# Patient Record
Sex: Female | Born: 1965 | Hispanic: No | Smoking: Never smoker
Health system: Southern US, Community
[De-identification: ages and names within clinical notes are randomized; demographics above are authoritative.]

## PROBLEM LIST (undated history)

## (undated) DIAGNOSIS — E119 Type 2 diabetes mellitus without complications: Secondary | ICD-10-CM

## (undated) DIAGNOSIS — E785 Hyperlipidemia, unspecified: Secondary | ICD-10-CM

## (undated) HISTORY — PX: DILATION AND CURETTAGE OF UTERUS: SHX78

## (undated) HISTORY — DX: Type 2 diabetes mellitus without complications: E11.9

## (undated) HISTORY — DX: Hyperlipidemia, unspecified: E78.5

---
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
## 2016-05-27 ENCOUNTER — Other Ambulatory Visit: Payer: Self-pay | Admitting: Obstetrics and Gynecology

## 2016-05-27 DIAGNOSIS — Z1239 Encounter for other screening for malignant neoplasm of breast: Secondary | ICD-10-CM

## 2016-06-04 ENCOUNTER — Encounter (HOSPITAL_COMMUNITY): Payer: Self-pay

## 2016-06-04 ENCOUNTER — Ambulatory Visit
Admission: RE | Admit: 2016-06-04 | Discharge: 2016-06-04 | Disposition: A | Discharge status: Home / Self Care | Payer: Self-pay | Source: Ambulatory Visit | Attending: Obstetrics and Gynecology | Admitting: Obstetrics and Gynecology

## 2016-06-04 ENCOUNTER — Ambulatory Visit (HOSPITAL_COMMUNITY)
Admission: RE | Admit: 2016-06-04 | Discharge: 2016-06-04 | Disposition: A | Discharge status: Home / Self Care | Payer: Self-pay | Source: Ambulatory Visit | Attending: Obstetrics and Gynecology | Admitting: Obstetrics and Gynecology

## 2016-06-04 VITALS — BP 120/78 | Temp 98.8°F | Ht 68.5 in | Wt 241.4 lb

## 2016-06-04 DIAGNOSIS — Z1239 Encounter for other screening for malignant neoplasm of breast: Secondary | ICD-10-CM

## 2016-06-04 NOTE — Patient Instructions (Signed)
Educational materials on self breast awareness given. Explained to [name] that she did not need a Pap smear today due to last Pap smear was in July 2015 per patient. Let her know [facility] will cover Pap smears every 3 years unless has a history of abnormal Pap smears. [facility]. Appointment scheduled for Thursday, June 04, 2016 at 1130. Let patient know the Breast Center will follow up with her within the next couple weeks with results of mammogram by letter or phone. [name] verbalized understanding.  Oaklie Durrett, Kathaleen Maserhristine Poll, RN 11:09 AM

## 2016-06-04 NOTE — Progress Notes (Signed)
No complaints today.   Pap Smear:  Pap smear not completed today. Last Pap [place] was in July 2015 in the [place] and normal per patient. Per patient has no history of an abnormal Pap smear. No Pap smear results are in EPIC.  Physical exam: Breasts Breasts symmetrical. No skin abnormalities bilateral breasts. No nipple retraction bilateral breasts. No nipple discharge bilateral breasts. No lymphadenopathy. No lumps palpated bilateral breasts. No complaints of pain or tenderness on exam.     [facility]. Appointment scheduled for Thursday, June 04, 2016 at 1130.  Pelvic/Bimanual No Pap smear completed today since last Pap smear was in July 2015 per patient. Pap smear not indicated per [facility] guidelines.   Smoking History: Patient has never smoked.  Patient Navigation: Patient education provided. [facility].

## 2016-06-05 ENCOUNTER — Encounter (HOSPITAL_COMMUNITY): Payer: Self-pay | Admitting: *Deleted

## 2016-06-26 ENCOUNTER — Telehealth: Payer: Self-pay

## 2016-06-26 NOTE — Telephone Encounter (Signed)
Called to inform about [facility]. Left message for patient to return call.

## 2017-01-01 ENCOUNTER — Encounter (HOSPITAL_COMMUNITY): Payer: Self-pay | Admitting: *Deleted

## 2017-01-01 ENCOUNTER — Ambulatory Visit (HOSPITAL_COMMUNITY)
Admission: RE | Admit: 2017-01-01 | Discharge: 2017-01-01 | Disposition: A | Discharge status: Home / Self Care | Payer: No Typology Code available for payment source | Source: Ambulatory Visit | Attending: Obstetrics and Gynecology | Admitting: Obstetrics and Gynecology

## 2017-01-01 VITALS — BP 118/80 | Ht 68.5 in | Wt 241.2 lb

## 2017-01-01 DIAGNOSIS — Z01419 Encounter for gynecological examination (general) (routine) without abnormal findings: Secondary | ICD-10-CM

## 2017-01-01 NOTE — Progress Notes (Signed)
Pap smear completed

## 2017-01-01 NOTE — Addendum Note (Signed)
Encounter addended by: Lynnell DikeSabrina H Shivaun Bilello, LPN on: 6/2/13082/07/2017  2:13 PM<BR>    Actions taken: Order list changed

## 2017-01-01 NOTE — Patient Instructions (Signed)
Explained to [name] that if today's Pap smear is normal and HPV negative that her next Pap smear will be due in 5 years since per patient has no history of an abnormal Pap smear. Reminded patient that her mammogram is due in July 2018 and she will need to renew her [facility] card. Told patient to call Martie LeeSabrina a month or two before to schedule appointment with [facility]. Let patient know will follow up with her within the next couple weeks with results of Pap smear by phone. [name] verbalized understanding.  [place], Kathaleen Maserhristine Poll, RN 1:58 PM

## 2017-01-01 NOTE — Progress Notes (Signed)
No complaints today.   Pap Smear: Pap smear completed today. Last Pap smear was in July 2015 in the [place] and normal per patient. Per patient has no history of an abnormal Pap smear. No Pap smear results are in EPIC.  Pelvic/Bimanual   Ext Genitalia No lesions, no swelling and no discharge observed on external genitalia.         Vagina Vagina pink and normal texture. No lesions and cervical mucous observed in vagina.          Cervix Cervix is present. Cervix pink and of normal texture. No discharge observed.     Uterus Uterus is present and palpable. Uterus in normal position and normal size.        Adnexae Bilateral ovaries present and palpable. No tenderness on palpation.          Rectovaginal No rectal exam completed today since patient had no rectal complaints. No skin abnormalities observed on exam.    Smoking History: Patient has never smoked.  Patient Navigation: Patient education provided. Access to services provided for patient through [facility] program.   Colorectal Cancer Screening: Per patient has never had a colonoscopy completed. No complaints today. FIT Test given to patient to complete and return to [facility].

## 2017-01-06 LAB — CYTOLOGY - PAP
Diagnosis: UNDETERMINED — AB
HPV: DETECTED — AB

## 2017-01-11 ENCOUNTER — Telehealth (HOSPITAL_COMMUNITY): Payer: Self-pay | Admitting: *Deleted

## 2017-01-11 NOTE — Telephone Encounter (Signed)
Telephoned patient at home number and discussed abnormal pap smear results. Advised patient would need colposcopy due to abnormal pap smear. [facility] 10:20. Patient voiced understanding.

## 2017-02-01 ENCOUNTER — Encounter: Payer: Self-pay | Admitting: Obstetrics and Gynecology

## 2017-02-01 ENCOUNTER — Other Ambulatory Visit (HOSPITAL_COMMUNITY)
Admission: RE | Admit: 2017-02-01 | Discharge: 2017-02-01 | Disposition: A | Discharge status: Home / Self Care | Payer: Self-pay | Source: Ambulatory Visit | Attending: Obstetrics and Gynecology | Admitting: Obstetrics and Gynecology

## 2017-02-01 ENCOUNTER — Ambulatory Visit (INDEPENDENT_AMBULATORY_CARE_PROVIDER_SITE_OTHER): Payer: No Typology Code available for payment source | Admitting: Obstetrics and Gynecology

## 2017-02-01 DIAGNOSIS — Z3202 Encounter for pregnancy test, result negative: Secondary | ICD-10-CM

## 2017-02-01 DIAGNOSIS — R87810 Cervical high risk human papillomavirus (HPV) DNA test positive: Secondary | ICD-10-CM

## 2017-02-01 DIAGNOSIS — Z1151 Encounter for screening for human papillomavirus (HPV): Secondary | ICD-10-CM | POA: Insufficient documentation

## 2017-02-01 DIAGNOSIS — R87610 Atypical squamous cells of undetermined significance on cytologic smear of cervix (ASC-US): Secondary | ICD-10-CM | POA: Insufficient documentation

## 2017-02-01 LAB — POCT PREGNANCY, URINE: Preg Test, Ur: NEGATIVE

## 2017-02-01 NOTE — Patient Instructions (Signed)

## 2017-02-01 NOTE — Progress Notes (Signed)
Colposcopy Procedure Note  Indications: Pap smear 1 months ago showed: ASCUS with POSITIVE high risk HPV. The prior pap showed no abnormalities.  H/O of abnormal pap smear in the distant past, repeat was normal. Sexual active, no contraception and no H/O STD's. No prior cervical treatmentsPrior cervical/vaginal disease: UPT negative   Procedure Details  The risks and benefits of the procedure and Verbal informed consent obtained.  Speculum placed in vagina and excellent visualization of cervix achieved, cervix swabbed x 3 with acetic acid solution.  Findings: Cervix: no visible lesions;  D[place] noted, TZ noted Vaginal inspection: vaginal colposcopy not performed. Vulvar colposcopy: vulvar colposcopy not performed.  Specimens: ECC, Bx 12 and 6 o'clock Monsel's applied. Pt tolerated well  Complications: none.  Plan: Specimens labelled and sent to Pathology.

## 2017-02-01 NOTE — Addendum Note (Signed)
Addended by: Hermina StaggersERVIN, Honi Name L on: 02/01/2017 11:22 AM   Modules accepted: Level of Service

## 2017-02-09 ENCOUNTER — Telehealth: Payer: Self-pay | Admitting: General Practice

## 2017-02-09 NOTE — Telephone Encounter (Signed)
Per Dr Alysia PennaErvin, patient needs cryo. Called patient & informed her of results, recommendation, and that someone from the front office will contact her with an appt. Patient verbalized understanding and had no questions.

## 2017-02-10 ENCOUNTER — Telehealth: Payer: Self-pay | Admitting: General Practice

## 2017-03-05 ENCOUNTER — Ambulatory Visit (INDEPENDENT_AMBULATORY_CARE_PROVIDER_SITE_OTHER): Payer: Self-pay | Admitting: Obstetrics and Gynecology

## 2017-03-05 ENCOUNTER — Encounter: Payer: Self-pay | Admitting: Obstetrics and Gynecology

## 2017-03-05 DIAGNOSIS — Z3202 Encounter for pregnancy test, result negative: Secondary | ICD-10-CM

## 2017-03-05 DIAGNOSIS — N870 Mild cervical dysplasia: Secondary | ICD-10-CM

## 2017-03-05 LAB — POCT PREGNANCY, URINE: Preg Test, Ur: NEGATIVE

## 2017-03-05 NOTE — Progress Notes (Signed)
Cryotherapy Procedure Note  Pre-operative Diagnosis: mild cervical dysplasia   Post-operative Diagnosis: mild cervical dysplasia  Locations: cervix   Indications: mild cervical dysplasia  Anesthesia: not required   Procedure Details  History of allergy to iodine: no. Pacemaker? no.  Patient informed of risk and benefits of the procedure and  Verbal and written informed consent obtained.  The areas are treated with liquid nitrogen therapy, 3 minute freeze followed by 1 minute thaw. This was repeat x 1. The patient tolerated procedure well.  The patient was instructed on post-op care. Instructed to RTC for any concerns  Condition: Stable  Complications: none.  Plan: F/U appt in 4 weeks

## 2017-03-06 LAB — POCT PREGNANCY, URINE: PREG TEST UR: NEGATIVE

## 2017-04-09 ENCOUNTER — Ambulatory Visit (INDEPENDENT_AMBULATORY_CARE_PROVIDER_SITE_OTHER): Payer: Self-pay | Admitting: Obstetrics and Gynecology

## 2017-04-09 DIAGNOSIS — N870 Mild cervical dysplasia: Secondary | ICD-10-CM

## 2017-04-09 NOTE — Progress Notes (Signed)
Ms Cox is her for follow after cryotherapy on 03/05/17 for mild cervical dysplasia. She is without complaints today. She has had a cycle since the procedure. Vaginal discharge is min She denies any bowel or bladder dysfunction  PE AF VSS GU cervix healing well  A/P S/P Cryotherapy of cervix for mild cervical dysplasia  Continue with normal ADL's.  F/U with [facility]CP in 1 yearly for yearly exam.

## 2017-05-12 ENCOUNTER — Other Ambulatory Visit: Payer: Self-pay | Admitting: Obstetrics and Gynecology

## 2017-05-12 DIAGNOSIS — Z1231 Encounter for screening mammogram for malignant neoplasm of breast: Secondary | ICD-10-CM

## 2017-06-10 ENCOUNTER — Ambulatory Visit (HOSPITAL_COMMUNITY)
Admission: RE | Admit: 2017-06-10 | Discharge: 2017-06-10 | Disposition: A | Discharge status: Home / Self Care | Payer: Self-pay | Source: Ambulatory Visit | Attending: Obstetrics and Gynecology | Admitting: Obstetrics and Gynecology

## 2017-06-10 ENCOUNTER — Ambulatory Visit
Admission: RE | Admit: 2017-06-10 | Discharge: 2017-06-10 | Disposition: A | Discharge status: Home / Self Care | Payer: Self-pay | Source: Ambulatory Visit | Attending: Obstetrics and Gynecology | Admitting: Obstetrics and Gynecology

## 2017-06-10 ENCOUNTER — Encounter (HOSPITAL_COMMUNITY): Payer: Self-pay

## 2017-06-10 VITALS — BP 126/80 | Temp 98.5°F | Ht 68.0 in

## 2017-06-10 DIAGNOSIS — Z1239 Encounter for other screening for malignant neoplasm of breast: Secondary | ICD-10-CM

## 2017-06-10 DIAGNOSIS — Z1231 Encounter for screening mammogram for malignant neoplasm of breast: Secondary | ICD-10-CM

## 2017-06-10 NOTE — Patient Instructions (Signed)
Explained breast self awareness with Evelina Bucyoreen Cox. Patient did not need a Pap smear today due to last Pap smear was 01/06/2017. Let patient know that her next Pap smear is due next March 2019 due to her recent history of an abnormal Pap smear. [facility]. Appointment scheduled for Thursday, June 10, 2017 at 1040. Let patient know the Breast Center will follow up with her within the next couple weeks with results of mammogram by letter or phone. Paul DykesDoreen Cox verbalized understanding.  Evalette Montrose, Kathaleen Maserhristine Poll, RN 12:56 PM

## 2017-06-10 NOTE — Progress Notes (Signed)
No complaints today.   Pap Smear: Pap smear not completed today. [facility] ASC[facility] with positive HPV. Patient had a colposcopy completed 02/01/2017 that was CIN-I for follow-up. Cryotherapy was completed 03/05/2017. Per patient her last Pap smear is the only abnormal Pap smear she has had. Last Pap smear, colposcopy, and cryotherapy results are in EPIC.  Physical exam: Breasts Breasts symmetrical. No skin abnormalities bilateral breasts. No nipple retraction bilateral breasts. No nipple discharge bilateral breasts. No lymphadenopathy. No lumps palpated bilateral breasts. No complaints of pain or tenderness on exam. [facility] mamm[facility]. Appointment scheduled for Thursday, June 10, 2017 at 1040.        Pelvic/Bimanual No Pap smear completed today since last Pap smear was 01/06/2017. Pap smear not indicated per BCCCP guidelines.   Smoking History: Patient has never smoked.  Patient Navigation: Patient education provided. Access to services provided for patient through [facility] program.   Colorectal Cancer Screening: Per patient has never had a colonoscopy completed. No complaints today. FIT Test given to patient to complete and return to [facility].

## 2017-06-11 ENCOUNTER — Encounter (HOSPITAL_COMMUNITY): Payer: Self-pay | Admitting: *Deleted

## 2018-02-05 ENCOUNTER — Other Ambulatory Visit: Payer: Self-pay

## 2018-02-17 LAB — FECAL OCCULT BLOOD, IMMUNOCHEMICAL: Fecal Occult Bld: NEGATIVE

## 2018-02-21 ENCOUNTER — Encounter (HOSPITAL_COMMUNITY): Payer: Self-pay

## 2018-04-05 ENCOUNTER — Ambulatory Visit (HOSPITAL_COMMUNITY): Payer: No Typology Code available for payment source

## 2018-05-13 ENCOUNTER — Other Ambulatory Visit: Payer: Self-pay | Admitting: Obstetrics and Gynecology

## 2018-05-13 DIAGNOSIS — Z1231 Encounter for screening mammogram for malignant neoplasm of breast: Secondary | ICD-10-CM

## 2018-06-16 ENCOUNTER — Ambulatory Visit (HOSPITAL_COMMUNITY)
Admission: RE | Admit: 2018-06-16 | Discharge: 2018-06-16 | Disposition: A | Discharge status: Home / Self Care | Payer: Self-pay | Source: Ambulatory Visit | Attending: Obstetrics and Gynecology | Admitting: Obstetrics and Gynecology

## 2018-06-16 ENCOUNTER — Encounter (HOSPITAL_COMMUNITY): Payer: Self-pay

## 2018-06-16 ENCOUNTER — Ambulatory Visit
Admission: RE | Admit: 2018-06-16 | Discharge: 2018-06-16 | Disposition: A | Discharge status: Home / Self Care | Payer: No Typology Code available for payment source | Source: Ambulatory Visit | Attending: Obstetrics and Gynecology | Admitting: Obstetrics and Gynecology

## 2018-06-16 VITALS — BP 152/100 | Ht 68.5 in | Wt 257.2 lb

## 2018-06-16 DIAGNOSIS — Z1231 Encounter for screening mammogram for malignant neoplasm of breast: Secondary | ICD-10-CM

## 2018-06-16 DIAGNOSIS — Z01419 Encounter for gynecological examination (general) (routine) without abnormal findings: Secondary | ICD-10-CM

## 2018-06-16 NOTE — Patient Instructions (Signed)
Explained breast self awareness with Tanya Bucyoreen Bowen. Let patient know that if today's Pap smear is normal that her next Pap smear will be due in one year. [facility] mamm[facility]. Appointment scheduled for Thursday, June 16, 2018 at 1240. Let patient know will follow up with her within the next couple weeks with results of Pap smear by letter or phone. Informed patient that the Breast Center will follow-up with her within the next couple of weeks with results of mammogram by letter or phone. [name] verbalized understanding.  [name], Kathaleen Maserhristine Poll, RN 10:44 AM

## 2018-06-16 NOTE — Progress Notes (Signed)
No complaints today.   Pap Smear: Pap smear not completed today. [facility] with positive HPV. Patient had a colposcopy completed 02/01/2017 that was CIN-I for follow-up. Cryotherapy was completed 03/05/2017. Per patient her last Pap smear is the only abnormal Pap smear she has had. Last Pap smear, colposcopy, and cryotherapy results are in EPIC.  Physical exam: Breasts Breasts symmetrical. No skin abnormalities bilateral breasts. No nipple retraction bilateral breasts. No nipple discharge bilateral breasts. No lymphadenopathy. No lumps palpated bilateral breasts. No complaints of pain or tenderness on exam. [facility] mamm[facility]. Appointment scheduled for Thursday, June 16, 2018 at 1240.     Pelvic/Bimanual   Ext Genitalia No lesions, no swelling and no discharge observed on external genitalia.         Vagina Vagina pink and normal texture. No lesions or discharge observed in vagina.          Cervix Cervix is present. Cervix pink and of normal texture. No discharge observed.     Uterus Uterus is present and palpable. Uterus in normal position and normal size.        Adnexae Bilateral ovaries present and palpable. No tenderness on palpation.         Rectovaginal No rectal exam completed today since patient had no rectal complaints. No skin abnormalities observed on exam.    Smoking History: Patient has never smoked.  Patient Navigation: Patient education provided. Access to services provided for patient through [facility] program.  Colorectal Cancer Screening: Per patient has never had a colonoscopy completed. FIT Test completed 02/05/2018 that was negative. No complaints today.   Breast and Cervical Cancer Risk Assessment: Patient has no family history of breast cancer, known genetic mutations, or radiation treatment to the chest before age 52. Patient has a history of cervical dysplasia.  Patient has no history of being immunocompromised or DES exposure in-utero.  Risk Assessment    Risk Scores      06/16/2018   Last edited by: Lynnell DikeHolland, Sabrina H, LPN   5-year risk: 1.2 %   Lifetime risk: 8.5 %

## 2018-06-17 ENCOUNTER — Encounter (HOSPITAL_COMMUNITY): Payer: Self-pay | Admitting: *Deleted

## 2018-06-17 LAB — CYTOLOGY - PAP
Adequacy: ABSENT
DIAGNOSIS: NEGATIVE
HPV (WINDOPATH): NOT DETECTED

## 2018-06-20 ENCOUNTER — Telehealth (HOSPITAL_COMMUNITY): Payer: Self-pay | Admitting: *Deleted

## 2018-06-20 NOTE — Telephone Encounter (Signed)
Called patient to discuss Pap smear results. Let patient know that her Pap smear was normal and HPV typing was negative. Told patient that her next Pap smear is due in one year due to her Pap smear last year was abnormal. Patient verbalized understanding.

## 2020-10-23 HISTORY — PX: COLONOSCOPY: SHX174

## 2021-02-13 ENCOUNTER — Ambulatory Visit: Payer: No Typology Code available for payment source | Admitting: Obstetrics and Gynecology

## 2021-07-15 ENCOUNTER — Other Ambulatory Visit: Payer: Self-pay | Admitting: Family Medicine

## 2021-07-15 DIAGNOSIS — Z1231 Encounter for screening mammogram for malignant neoplasm of breast: Secondary | ICD-10-CM

## 2021-08-07 ENCOUNTER — Other Ambulatory Visit: Payer: Self-pay

## 2021-08-07 ENCOUNTER — Ambulatory Visit
Admission: RE | Admit: 2021-08-07 | Discharge: 2021-08-07 | Disposition: A | Discharge status: Home / Self Care | Payer: 59 | Source: Ambulatory Visit | Attending: Family Medicine | Admitting: Family Medicine

## 2021-08-07 DIAGNOSIS — Z1231 Encounter for screening mammogram for malignant neoplasm of breast: Secondary | ICD-10-CM

## 2021-08-08 ENCOUNTER — Ambulatory Visit: Payer: No Typology Code available for payment source

## 2021-08-13 ENCOUNTER — Other Ambulatory Visit: Payer: Self-pay | Admitting: Obstetrics and Gynecology

## 2021-08-13 ENCOUNTER — Other Ambulatory Visit: Payer: Self-pay | Admitting: Family Medicine

## 2021-08-13 ENCOUNTER — Other Ambulatory Visit: Payer: Self-pay | Admitting: *Deleted

## 2021-08-13 DIAGNOSIS — R928 Other abnormal and inconclusive findings on diagnostic imaging of breast: Secondary | ICD-10-CM

## 2021-08-14 ENCOUNTER — Other Ambulatory Visit: Payer: Self-pay | Admitting: Family Medicine

## 2021-08-14 DIAGNOSIS — R928 Other abnormal and inconclusive findings on diagnostic imaging of breast: Secondary | ICD-10-CM

## 2021-08-29 ENCOUNTER — Ambulatory Visit
Admission: RE | Admit: 2021-08-29 | Discharge: 2021-08-29 | Disposition: A | Discharge status: Home / Self Care | Payer: 59 | Source: Ambulatory Visit | Attending: Family Medicine | Admitting: Family Medicine

## 2021-08-29 ENCOUNTER — Other Ambulatory Visit: Payer: Self-pay

## 2021-08-29 DIAGNOSIS — R928 Other abnormal and inconclusive findings on diagnostic imaging of breast: Secondary | ICD-10-CM

## 2021-09-01 ENCOUNTER — Other Ambulatory Visit: Payer: Self-pay | Admitting: Family Medicine

## 2021-09-01 ENCOUNTER — Ambulatory Visit
Admission: RE | Admit: 2021-09-01 | Discharge: 2021-09-01 | Disposition: A | Discharge status: Home / Self Care | Payer: 59 | Source: Ambulatory Visit | Attending: Family Medicine | Admitting: Family Medicine

## 2021-09-01 DIAGNOSIS — M25532 Pain in left wrist: Secondary | ICD-10-CM

## 2021-11-14 ENCOUNTER — Other Ambulatory Visit: Payer: Self-pay | Admitting: Surgery

## 2021-12-25 IMAGING — MG MM DIGITAL SCREENING BILAT W/ TOMO AND CAD
6 of 10 series · 6 of 30 positions shown · non-contrast
Comparison: Previous exam(s).

CLINICAL DATA: Screening.

EXAM:
DIGITAL SCREENING BILATERAL MAMMOGRAM WITH TOMOSYNTHESIS AND CAD
TECHNIQUE: Bilateral screening digital craniocaudal and mediolateral oblique
mammograms were obtained. Bilateral screening digital breast
tomosynthesis was performed. The images were evaluated with
computer-aided detection.

[L MLO synth-2D]
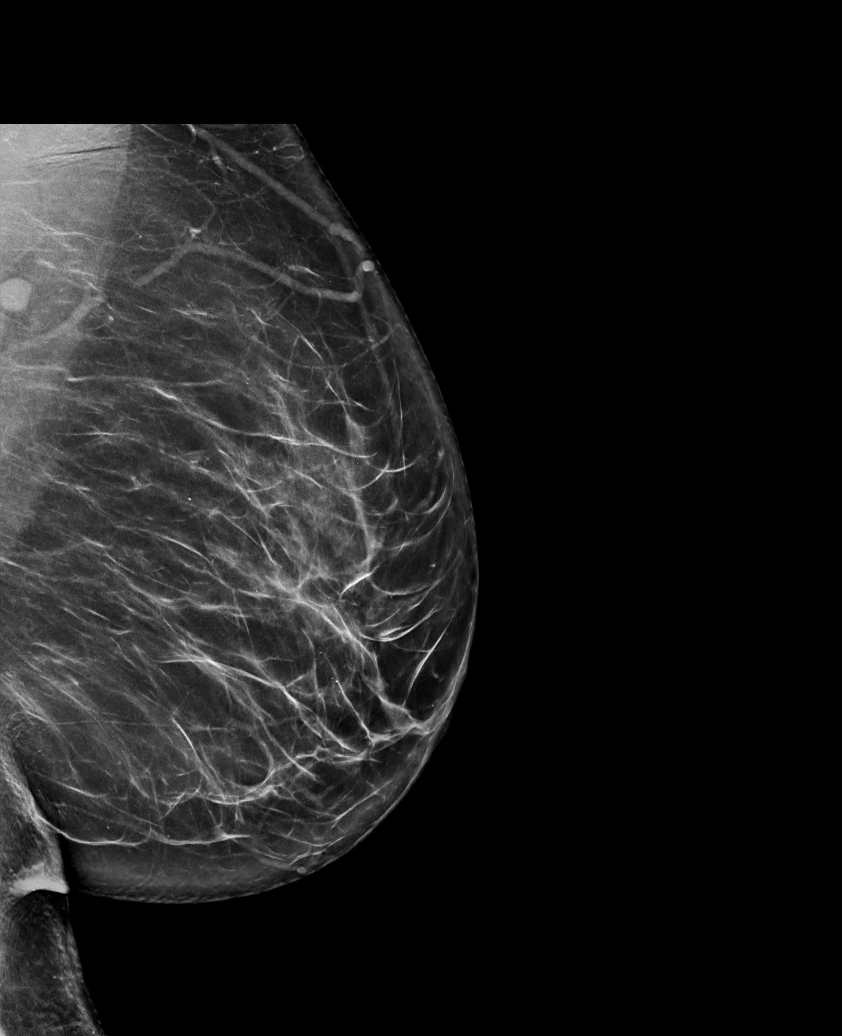

[L CC synth-2D (1 of 2)]
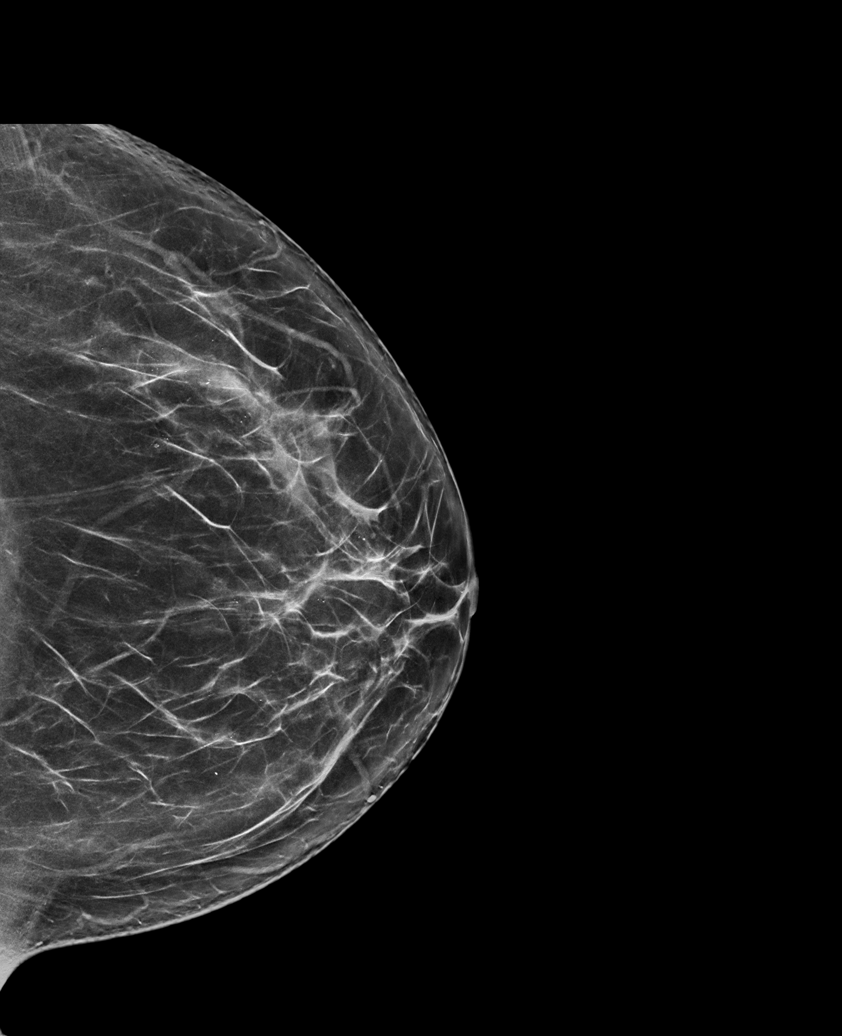

[R CC synth-2D]
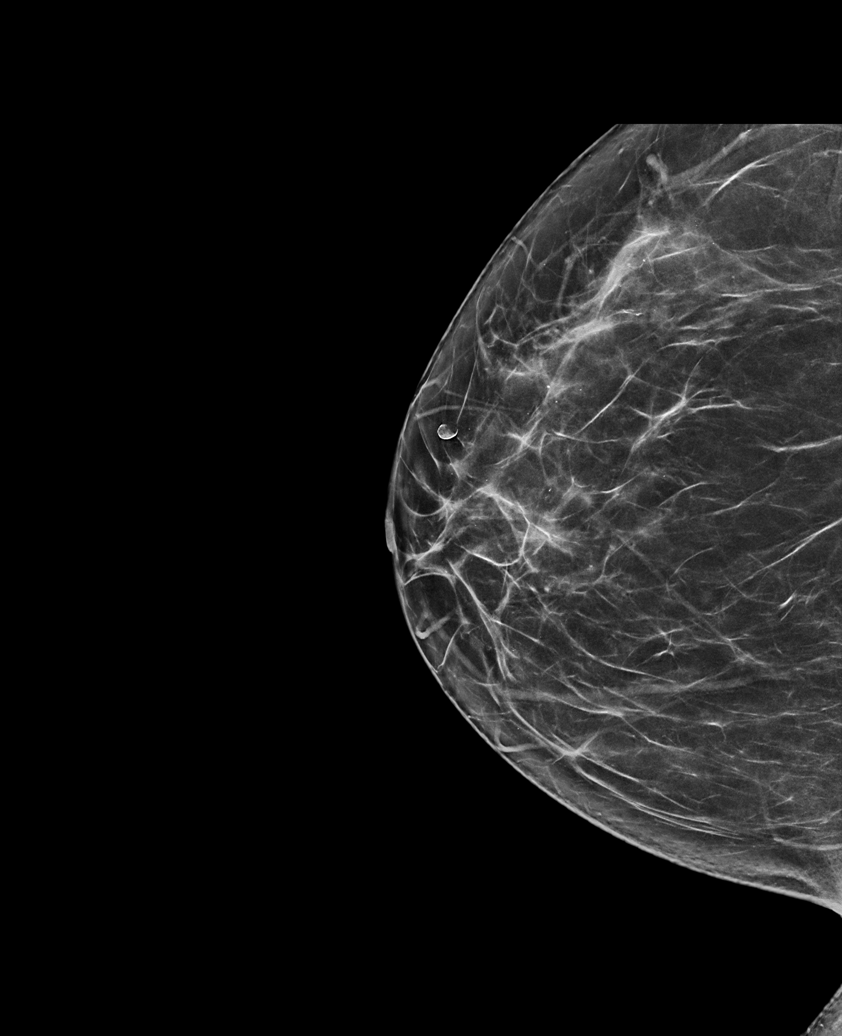

[R MLO synth-2D]
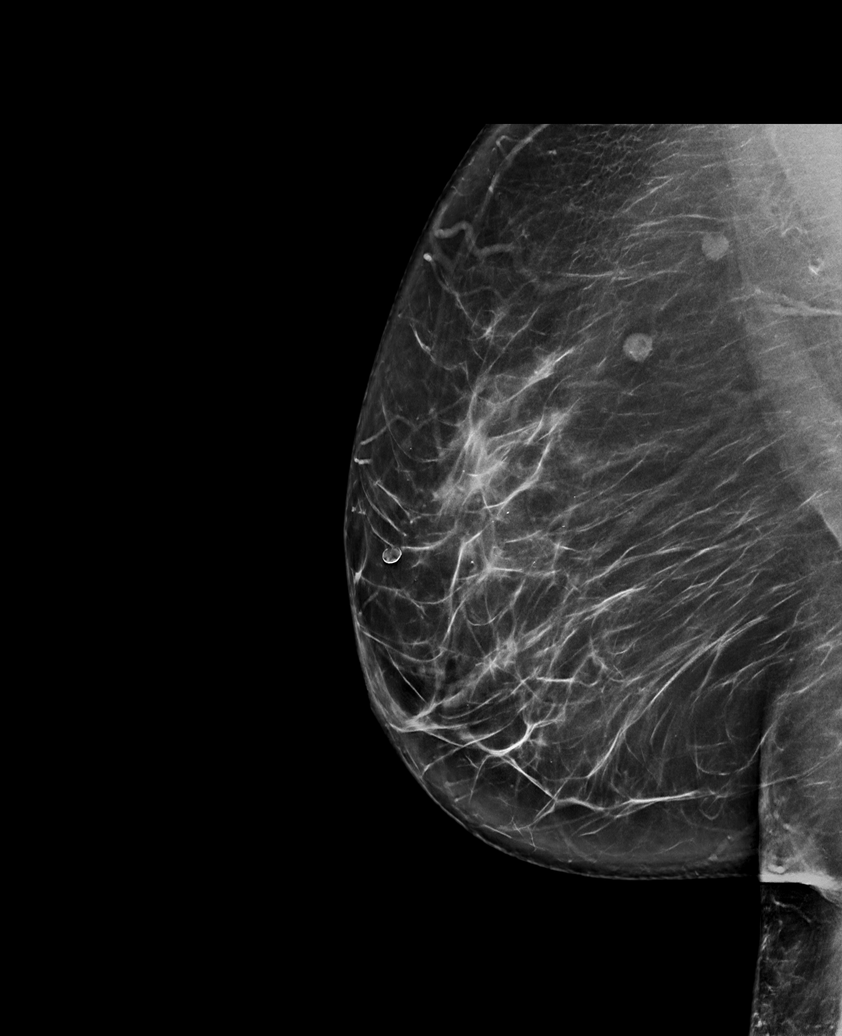

[L CC synth-2D (2 of 2)]
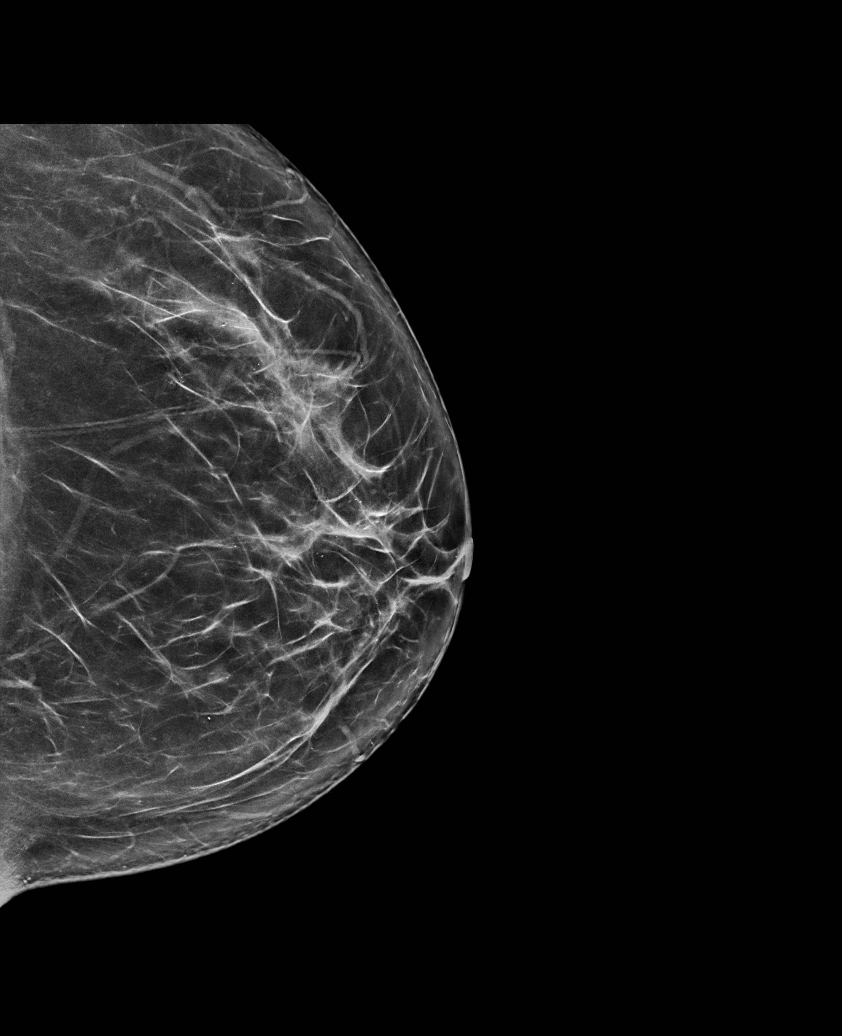

[L MLO tomo · tomo slice 43/86.0]
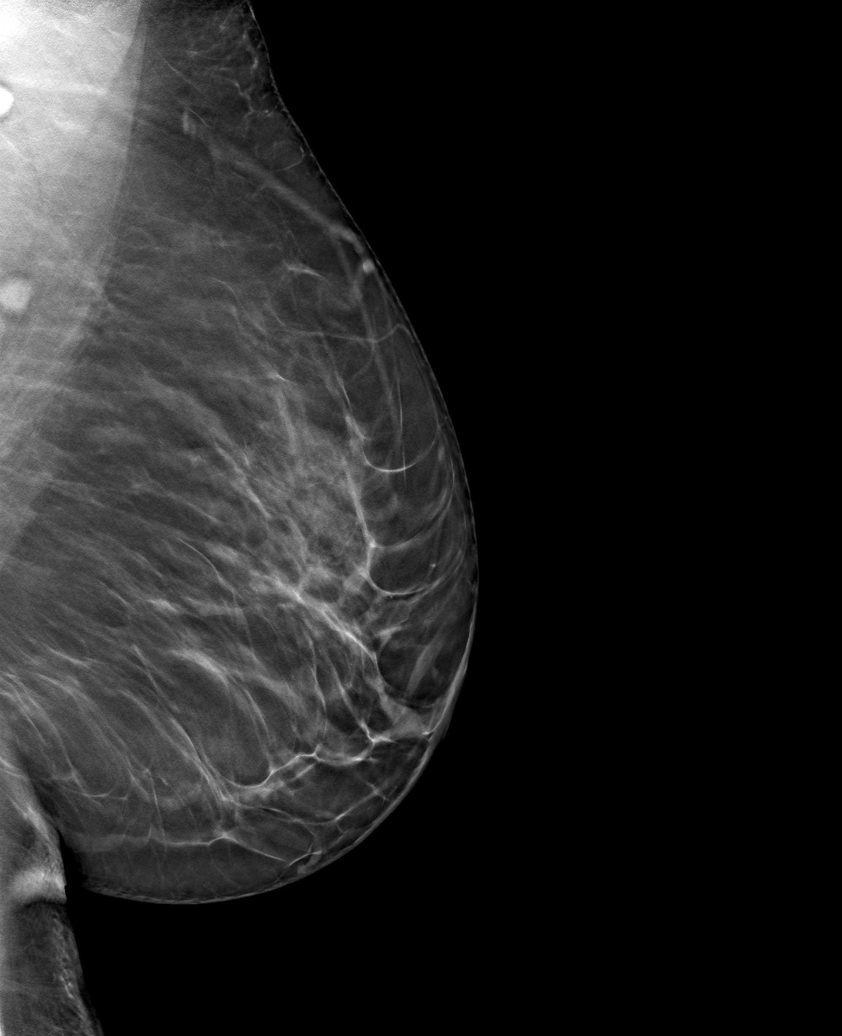

[6 of 30 positions shown; findings below may reference images not displayed]

ACR Breast Density Category b: There are scattered areas of
fibroglandular density.
FINDINGS: In the right breast a possible asymmetry requires further
evaluation.

In the left breast a possible mass requires further evaluation.
IMPRESSION: Further evaluation is suggested for possible asymmetry in the right
breast.

Further evaluation is suggested for possible mass in the left
breast.

RECOMMENDATION:
Diagnostic mammogram and possibly ultrasound of both breasts.
(Code:[id number])

The patient will be contacted regarding the findings, and additional
imaging will be scheduled.

BI-RADS CATEGORY  0: Incomplete. Need additional imaging evaluation
and/or prior mammograms for comparison.

## 2023-04-05 ENCOUNTER — Other Ambulatory Visit: Payer: Self-pay | Admitting: Family Medicine

## 2023-04-05 DIAGNOSIS — Z0000 Encounter for general adult medical examination without abnormal findings: Secondary | ICD-10-CM

## 2023-04-06 ENCOUNTER — Ambulatory Visit
Admission: RE | Admit: 2023-04-06 | Discharge: 2023-04-06 | Disposition: A | Discharge status: Home / Self Care | Payer: Commercial Managed Care - PPO | Source: Ambulatory Visit | Attending: Family Medicine | Admitting: Family Medicine

## 2023-04-06 DIAGNOSIS — Z1231 Encounter for screening mammogram for malignant neoplasm of breast: Secondary | ICD-10-CM | POA: Diagnosis not present

## 2023-04-06 DIAGNOSIS — Z0000 Encounter for general adult medical examination without abnormal findings: Secondary | ICD-10-CM

## 2023-06-04 ENCOUNTER — Other Ambulatory Visit: Payer: Self-pay | Admitting: Oncology

## 2023-06-04 DIAGNOSIS — Z006 Encounter for examination for normal comparison and control in clinical research program: Secondary | ICD-10-CM

## 2023-09-07 ENCOUNTER — Ambulatory Visit: Payer: Commercial Managed Care - PPO | Admitting: Family

## 2023-09-07 ENCOUNTER — Encounter: Payer: Self-pay | Admitting: Family

## 2023-09-07 VITALS — BP 127/88 | HR 81 | Temp 98.1°F | Resp 18 | Ht 68.5 in | Wt 260.8 lb

## 2023-09-07 DIAGNOSIS — Z1159 Encounter for screening for other viral diseases: Secondary | ICD-10-CM | POA: Diagnosis not present

## 2023-09-07 DIAGNOSIS — E782 Mixed hyperlipidemia: Secondary | ICD-10-CM

## 2023-09-07 DIAGNOSIS — L819 Disorder of pigmentation, unspecified: Secondary | ICD-10-CM

## 2023-09-07 DIAGNOSIS — N870 Mild cervical dysplasia: Secondary | ICD-10-CM | POA: Diagnosis not present

## 2023-09-07 DIAGNOSIS — R0683 Snoring: Secondary | ICD-10-CM | POA: Diagnosis not present

## 2023-09-07 DIAGNOSIS — Z13220 Encounter for screening for lipoid disorders: Secondary | ICD-10-CM

## 2023-09-07 DIAGNOSIS — Z7689 Persons encountering health services in other specified circumstances: Secondary | ICD-10-CM

## 2023-09-07 DIAGNOSIS — Z113 Encounter for screening for infections with a predominantly sexual mode of transmission: Secondary | ICD-10-CM | POA: Diagnosis not present

## 2023-09-07 NOTE — Progress Notes (Signed)
Provider: Richarda Blade FNP-C   Horald Birky, Donalee Citrin, NP  Patient Care Team: Mone Commisso, Donalee Citrin, NP as PCP - General (Family Medicine)  Extended Emergency Contact Information Primary Emergency Contact: Cox,Denicia  [place] Mobile Phone: [phone] Relation: Daughter  Code Status:  Full Code  Goals of care: Advanced Directive information    09/07/2023   12:38 PM  Advanced Directives  Does Patient Have a Medical Advance Directive? No  Would patient like information on creating a medical advance directive? No - Patient declined     Chief Complaint  Patient presents with   Establish Care    new pat to be est    HPI:  Pt is a 57 y.o. female seen today establish care here at [facility] for medical management of chronic diseases.  Hyperlipidemia - high last year she was prescribe cholesterol medication but states did not take it.takes omega-3 fatty acids 1000 mg capsule daily. She changed her diet by cutting out red meat and pork.Has been including veggies in her diet. Exercise on and off walk 45 minutes at least   She complains of feeling like blood sugars are dropping.feels hungry especial when she fast once a week.Has been doing some cleansing.  Colonoscopy in 2021 will obtain records from [facility].   Had a pap smear done.   History reviewed. No pertinent past medical history. Past Surgical History:  Procedure Laterality Date   childbirth  1982   1986, 1998   COLONOSCOPY  10/2020   DILATION AND CURETTAGE OF UTERUS      No Known Allergies  Allergies as of 09/07/2023   No Known Allergies      Medication List        Accurate as of September 07, 2023  1:11 PM. If you have any questions, ask your nurse or doctor.          STOP taking these medications    BLACK CURRANT SEED OIL PO Stopped by: Gracielynn Birkel C Dreon Pineda   Echinacea 125 MG Caps Stopped by: Angeliki Mates C Hancel Ion       TAKE these medications     cholecalciferol 1000 units tablet Commonly known as: VITAMIN D Take 1,000 Units by mouth daily.   COD LIVER OIL PO Take by mouth.   EVENING PRIMROSE OIL PO Take by mouth.   Fish Oil 1000 MG Caps Take by mouth.   ibuprofen 800 MG tablet Commonly known as: ADVIL Take 800 mg by mouth every 8 (eight) hours as needed.   loratadine 10 MG tablet Commonly known as: CLARITIN Take 10 mg by mouth daily as needed for allergies.   multivitamin capsule Take 1 capsule by mouth daily.   vitamin C 100 MG tablet Take 100 mg by mouth daily.        Review of Systems  Constitutional:  Negative for appetite change, chills, fatigue, fever and unexpected weight change.  HENT:  Negative for congestion, dental problem, ear discharge, ear pain, facial swelling, hearing loss, nosebleeds, postnasal drip, rhinorrhea, sinus pressure, sinus pain, sneezing, sore throat, tinnitus and trouble swallowing.   Eyes:  Negative for pain, discharge, redness, itching and visual disturbance.  Respiratory:  Negative for cough, chest tightness, shortness of breath and wheezing.   Cardiovascular:  Negative for chest pain, palpitations and leg swelling.  Gastrointestinal:  Negative for abdominal distention, abdominal pain, blood in stool, constipation, diarrhea, nausea and vomiting.  Endocrine: Negative for cold intolerance, heat intolerance, polydipsia, polyphagia and polyuria.  Genitourinary:  Negative for difficulty urinating, dysuria, flank pain, frequency and urgency.  Musculoskeletal:  Negative for arthralgias, back pain, gait problem, joint swelling, myalgias, neck pain and neck stiffness.  Skin:  Negative for color change, pallor, rash and wound.  Neurological:  Negative for dizziness, syncope, speech difficulty, weakness, light-headedness, numbness and headaches.  Hematological:  Does not bruise/bleed easily.  Psychiatric/Behavioral:  Negative for agitation, behavioral problems, confusion, hallucinations,  self-injury, sleep disturbance and suicidal ideas. The patient is not nervous/anxious.        Snoring at night     Immunization History  Administered Date(s) Administered   Influenza Split 09/11/2016   Influenza,inj,Quad PF,6+ Mos 08/29/2021   PFIZER(Purple Top)SARS-COV-2 Vaccination 02/03/2020, 02/24/2020, 10/31/2020   Pertinent  Health Maintenance Due  Topic Date Due   Colonoscopy  Never done   INFLUENZA VACCINE  06/24/2023   MAMMOGRAM  04/05/2025      09/07/2023   12:58 PM  Fall Risk  Falls in the past year? 1  Was there an injury with Fall? 0  Fall Risk Category Calculator 2  Patient at Risk for Falls Due to No Fall Risks  Fall risk Follow up Falls evaluation completed   Functional Status Survey:    Vitals:   09/07/23 1238  BP: 127/88  Pulse: 81  Resp: 18  Temp: 98.1 F (36.7 C)  SpO2: 97%  Weight: 260 lb 12.8 oz (118.3 kg)  Height: 5' 8.5" (1.74 m)   Body mass index is 39.08 kg/m. Physical Exam Vitals reviewed.  Constitutional:      General: She is not in acute distress.    Appearance: Normal appearance. She is obese. She is not ill-appearing or diaphoretic.  HENT:     Head: Normocephalic.     Right Ear: Tympanic membrane, ear canal and external ear normal. There is no impacted cerumen.     Left Ear: Tympanic membrane, ear canal and external ear normal. There is no impacted cerumen.     Nose: Nose normal. No congestion or rhinorrhea.     Mouth/Throat:     Mouth: Mucous membranes are moist.     Pharynx: Oropharynx is clear. No oropharyngeal exudate or posterior oropharyngeal erythema.  Eyes:     General: No scleral icterus.       Right eye: No discharge.        Left eye: No discharge.     Extraocular Movements: Extraocular movements intact.     Conjunctiva/sclera: Conjunctivae normal.     Pupils: Pupils are equal, round, and reactive to light.  Neck:     Vascular: No carotid bruit.  Cardiovascular:     Rate and Rhythm: Normal rate and regular  rhythm.     Pulses: Normal pulses.     Heart sounds: Normal heart sounds. No murmur heard.    No friction rub. No gallop.  Pulmonary:     Effort: Pulmonary effort is normal. No respiratory distress.     Breath sounds: Normal breath sounds. No wheezing, rhonchi or rales.  Chest:     Chest wall: No tenderness.  Abdominal:     General: Bowel sounds are normal. There is no distension.     Palpations: Abdomen is soft. There is no mass.     Tenderness: There is no abdominal tenderness. There is no right CVA tenderness, left CVA tenderness, guarding or rebound.  Musculoskeletal:        General: No swelling or tenderness. Normal range of motion.     Cervical back: Normal  range of motion. No rigidity or tenderness.     Right lower leg: No edema.     Left lower leg: No edema.  Lymphadenopathy:     Cervical: No cervical adenopathy.  Skin:    General: Skin is warm and dry.     Coloration: Skin is not pale.     Findings: No bruising, erythema, lesion or rash.     Comments: Bilateral cheek and below orbit area skin hyperpigmentation  Neurological:     Mental Status: She is alert and oriented to person, place, and time.     Cranial Nerves: No cranial nerve deficit.     Sensory: No sensory deficit.     Motor: No weakness.     Coordination: Coordination normal.     Gait: Gait normal.  Psychiatric:        Mood and Affect: Mood normal.        Speech: Speech normal.        Behavior: Behavior normal.        Thought Content: Thought content normal.        Judgment: Judgment normal.     Labs reviewed: No results for input(s): "NA", "K", "CL", "CO2", "GLUCOSE", "BUN", "CREATININE", "CALCIUM", "MG", "PHOS" in the last 8760 hours. No results for input(s): "AST", "ALT", "ALKPHOS", "BILITOT", "PROT", "ALBUMIN" in the last 8760 hours. No results for input(s): "WBC", "NEUTROABS", "HGB", "HCT", "MCV", "PLT" in the last 8760 hours. No results found for: "TSH" No results found for: "HGBA1C" No results  found for: "CHOL", "HDL", "LDLCALC", "LDLDIRECT", "TRIG", "CHOLHDL"  Significant Diagnostic Results in last 30 days:  No results found.  Assessment/Plan 1. Mild dysplasia of cervix (CIN I) Plan available records on chart cytology Pap done 06/16/2018 results were negative for intraepithelial lesions or malignancy. - TSH; Future - COMPLETE METABOLIC PANEL WITH GFR; Future - CBC with Differential/Platelet; Future - Ambulatory referral to Gynecology  2. Mixed hyperlipidemia Rosuvastatin previously prescribed by previous provider but did not take it.  She changed her diet and exercise. -Continue on omega-3 fatty acids -Continue on dietary modification and exercise - Lipid panel; Future  3. Screen for STD (sexually transmitted disease) Reports no high risk behaviors - HIV Antibody (routine testing w rflx); Future  4. Encounter for hepatitis C screening test for low risk patient Low risk - Hepatitis C antibody; Future  5. Snoring Family member reports snoring and episodes of apneas when she is asleep.  Would like evaluation.  We discussed outpatient versus home sleep study.  Prefers home sleep study. - Home sleep test  6. Hyperpigmentation of skin Worsening on the face bilateral cheek and below orbit area skin hyperpigmentation - Ambulatory referral to Dermatology  7. Encounter to establish care Recommend release of medical records from previous provider then update chart and immunizations.  Family/ staff Communication: Reviewed plan of care with patient verbalized understanding  Labs/tests ordered:  - CBC with Differential/Platelet - CMP with eGFR([facility]) - TSH - Lipid panel - Hep C Antibody - HIV Antibody (routine testing w rflx)   Next Appointment : Return in about 1 year (around 09/06/2024) for annual Physical examination.   Caesar Bookman, NP

## 2023-09-08 ENCOUNTER — Other Ambulatory Visit: Payer: Self-pay

## 2023-09-08 ENCOUNTER — Other Ambulatory Visit: Payer: Commercial Managed Care - PPO

## 2023-09-08 DIAGNOSIS — N870 Mild cervical dysplasia: Secondary | ICD-10-CM

## 2023-09-08 DIAGNOSIS — Z1159 Encounter for screening for other viral diseases: Secondary | ICD-10-CM

## 2023-09-08 DIAGNOSIS — Z113 Encounter for screening for infections with a predominantly sexual mode of transmission: Secondary | ICD-10-CM

## 2023-09-08 DIAGNOSIS — Z13220 Encounter for screening for lipoid disorders: Secondary | ICD-10-CM

## 2023-09-09 LAB — CBC WITH DIFFERENTIAL/PLATELET
Absolute Lymphocytes: 2190 {cells}/uL (ref 850–3900)
Absolute Monocytes: 381 {cells}/uL (ref 200–950)
Basophils Absolute: 62 {cells}/uL (ref 0–200)
Basophils Relative: 1.1 %
Eosinophils Absolute: 140 {cells}/uL (ref 15–500)
Eosinophils Relative: 2.5 %
HCT: 43.7 % (ref 35.0–45.0)
Hemoglobin: 14 g/dL (ref 11.7–15.5)
MCH: 27.5 pg (ref 27.0–33.0)
MCHC: 32 g/dL (ref 32.0–36.0)
MCV: 85.7 fL (ref 80.0–100.0)
MPV: 10.5 fL (ref 7.5–12.5)
Monocytes Relative: 6.8 %
Neutro Abs: 2828 {cells}/uL (ref 1500–7800)
Neutrophils Relative %: 50.5 %
Platelets: 362 10*3/uL (ref 140–400)
RBC: 5.1 10*6/uL (ref 3.80–5.10)
RDW: 12.7 % (ref 11.0–15.0)
Total Lymphocyte: 39.1 %
WBC: 5.6 10*3/uL (ref 3.8–10.8)

## 2023-09-09 LAB — LIPID PANEL
Cholesterol: 231 mg/dL — ABNORMAL HIGH (ref <200)
HDL: 60 mg/dL (ref >=50)
LDL Cholesterol (Calc): 146 mg/dL — ABNORMAL HIGH
Non-HDL Cholesterol (Calc): 171 mg/dL — ABNORMAL HIGH (ref <130)
Total CHOL/HDL Ratio: 3.9 (calc) (ref <5.0)
Triglycerides: 128 mg/dL (ref <150)

## 2023-09-09 LAB — COMPLETE METABOLIC PANEL WITH GFR
AG Ratio: 1.5 (calc) (ref 1.0–2.5)
ALT: 16 U/L (ref 6–29)
AST: 16 U/L (ref 10–35)
Albumin: 4.3 g/dL (ref 3.6–5.1)
Alkaline phosphatase (APISO): 96 U/L (ref 37–153)
BUN: 17 mg/dL (ref 7–25)
CO2: 24 mmol/L (ref 20–32)
Calcium: 9.2 mg/dL (ref 8.6–10.4)
Chloride: 104 mmol/L (ref 98–110)
Creat: 0.93 mg/dL (ref 0.50–1.03)
Globulin: 2.8 g/dL (ref 1.9–3.7)
Glucose, Bld: 108 mg/dL — ABNORMAL HIGH (ref 65–99)
Potassium: 4.3 mmol/L (ref 3.5–5.3)
Sodium: 138 mmol/L (ref 135–146)
Total Bilirubin: 0.4 mg/dL (ref 0.2–1.2)
Total Protein: 7.1 g/dL (ref 6.1–8.1)
eGFR: 72 mL/min/{1.73_m2} (ref >=60)

## 2023-09-09 LAB — TSH: TSH: 3.08 m[IU]/L (ref 0.40–4.50)

## 2023-09-09 LAB — HIV ANTIBODY (ROUTINE TESTING W REFLEX): HIV 1&2 Ab, 4th Generation: NONREACTIVE

## 2023-09-09 LAB — HEPATITIS C ANTIBODY: Hepatitis C Ab: NONREACTIVE

## 2023-09-17 ENCOUNTER — Encounter: Payer: Self-pay | Admitting: Family

## 2023-09-24 ENCOUNTER — Ambulatory Visit: Payer: Commercial Managed Care - PPO | Admitting: Family

## 2023-09-24 ENCOUNTER — Encounter: Payer: Self-pay | Admitting: Family

## 2023-09-24 VITALS — BP 124/80 | HR 70 | Temp 97.0°F | Resp 20 | Ht 68.5 in | Wt 258.0 lb

## 2023-09-24 DIAGNOSIS — Z124 Encounter for screening for malignant neoplasm of cervix: Secondary | ICD-10-CM | POA: Diagnosis not present

## 2023-09-24 NOTE — Progress Notes (Signed)
Provider: Richarda Blade FNP-C  Jovon Winterhalter, Donalee Citrin, NP  Patient Care Team: Alexzavier Girardin, Donalee Citrin, NP as PCP - General (Family Medicine)  Extended Emergency Contact Information Primary Emergency Contact: Cox,Denicia  [place] Mobile Phone: [phone] Relation: Daughter  Code Status:  Full Code  Goals of care: Advanced Directive information    09/24/2023    2:21 PM  Advanced Directives  Does Patient Have a Medical Advance Directive? No  Would patient like information on creating a medical advance directive? No - Patient declined     Chief Complaint  Patient presents with   Follow-up    2 week follow up with pap smear     HPI:  Pt is a 57 y.o. female seen today for an acute visit for pap smear.she denies any vaginal bleeding or discharge.  States does own breast exam while in the shower.No acute issues.   Past Medical History:  Diagnosis Date   Hyperlipidemia    Past Surgical History:  Procedure Laterality Date   childbirth  1982   1986, 1998   COLONOSCOPY  10/2020   DILATION AND CURETTAGE OF UTERUS      No Known Allergies  Outpatient Encounter Medications as of 09/24/2023  Medication Sig   Ascorbic Acid (VITAMIN C) 100 MG tablet Take 100 mg by mouth daily.   cholecalciferol (VITAMIN D) 1000 units tablet Take 1,000 Units by mouth daily.   COD LIVER OIL PO Take by mouth.   EVENING PRIMROSE OIL PO Take by mouth.   ibuprofen (ADVIL,MOTRIN) 800 MG tablet Take 800 mg by mouth every 8 (eight) hours as needed.   Multiple Vitamin (MULTIVITAMIN) capsule Take 1 capsule by mouth daily.   loratadine (CLARITIN) 10 MG tablet Take 10 mg by mouth daily as needed for allergies. (Patient not taking: Reported on 09/24/2023)   Omega-3 Fatty Acids (FISH OIL) 1000 MG CAPS Take by mouth. (Patient not taking: Reported on 09/24/2023)   No facility-administered encounter medications on file as of 09/24/2023.    Review of Systems  Constitutional:  Negative for appetite  change, chills, fatigue, fever and unexpected weight change.  Eyes:  Negative for pain, discharge, redness, itching and visual disturbance.  Respiratory:  Negative for cough, chest tightness, shortness of breath and wheezing.   Cardiovascular:  Negative for chest pain, palpitations and leg swelling.  Gastrointestinal:  Negative for abdominal distention, abdominal pain, blood in stool, constipation, diarrhea, nausea and vomiting.  Endocrine: Negative for cold intolerance, heat intolerance, polydipsia, polyphagia and polyuria.  Genitourinary:  Negative for difficulty urinating, dysuria, flank pain, frequency and urgency.  Musculoskeletal:  Negative for arthralgias, back pain, gait problem, joint swelling, myalgias, neck pain and neck stiffness.  Skin:  Negative for color change, pallor, rash and wound.  Neurological:  Negative for dizziness, syncope, speech difficulty, weakness, light-headedness, numbness and headaches.  Hematological:  Does not bruise/bleed easily.  Psychiatric/Behavioral:  Negative for agitation, behavioral problems, confusion, hallucinations, self-injury, sleep disturbance and suicidal ideas. The patient is not nervous/anxious.     Immunization History  Administered Date(s) Administered   Influenza Split 09/11/2016   Influenza,inj,Quad PF,6+ Mos 08/29/2021   PFIZER(Purple Top)SARS-COV-2 Vaccination 02/03/2020, 02/24/2020, 10/31/2020   Pertinent  Health Maintenance Due  Topic Date Due   Colonoscopy  Never done   MAMMOGRAM  04/05/2025   INFLUENZA VACCINE  Completed      09/07/2023   12:58 PM 09/24/2023    2:20 PM  Fall Risk  Falls in the past year? 1 0  Was there an injury with Fall? 0 0  Fall Risk Category Calculator 2   Patient at Risk for Falls Due to No Fall Risks No Fall Risks  Fall risk Follow up Falls evaluation completed    Functional Status Survey:    Vitals:   09/24/23 1426  BP: 124/80  Pulse: 70  Resp: 20  Temp: (!) 97 F (36.1 C)  SpO2: 96%   Weight: 258 lb (117 kg)  Height: 5' 8.5" (1.74 m)   Body mass index is 38.66 kg/m. Physical Exam Vitals reviewed. Exam conducted with a chaperone present (demetri weaver).  Constitutional:      General: She is not in acute distress.    Appearance: Normal appearance. She is obese. She is not ill-appearing or diaphoretic.  Cardiovascular:     Rate and Rhythm: Normal rate and regular rhythm.     Pulses: Normal pulses.     Heart sounds: Normal heart sounds. No murmur heard.    No friction rub. No gallop.  Pulmonary:     Effort: Pulmonary effort is normal. No respiratory distress.     Breath sounds: Normal breath sounds. No wheezing, rhonchi or rales.  Chest:     Chest wall: No tenderness.  Abdominal:     General: Bowel sounds are normal. There is no distension.     Palpations: Abdomen is soft. There is no mass.     Tenderness: There is no abdominal tenderness. There is no right CVA tenderness, left CVA tenderness, guarding or rebound.     Hernia: There is no hernia in the left inguinal area or right inguinal area.  Genitourinary:    Exam position: Lithotomy position.     Labia:        Right: No rash, tenderness or lesion.        Left: No rash, tenderness or lesion.      Vagina: Normal.     Cervix: Normal.     Uterus: Normal.      Adnexa: Right adnexa normal and left adnexa normal.  Musculoskeletal:        General: No swelling or tenderness. Normal range of motion.     Right lower leg: No edema.     Left lower leg: No edema.  Lymphadenopathy:     Lower Body: No right inguinal adenopathy. No left inguinal adenopathy.  Skin:    General: Skin is warm and dry.     Coloration: Skin is not pale.     Findings: No bruising, erythema, lesion or rash.  Neurological:     Mental Status: She is alert and oriented to person, place, and time.     Cranial Nerves: No cranial nerve deficit.     Sensory: No sensory deficit.     Motor: No weakness.     Coordination: Coordination normal.      Gait: Gait normal.  Psychiatric:        Mood and Affect: Mood normal.        Speech: Speech normal.        Behavior: Behavior normal.     Labs reviewed: Recent Labs    09/08/23 0807  NA 138  K 4.3  CL 104  CO2 24  GLUCOSE 108*  BUN 17  CREATININE 0.93  CALCIUM 9.2   Recent Labs    09/08/23 0807  AST 16  ALT 16  BILITOT 0.4  PROT 7.1   Recent Labs    09/08/23 0807  WBC 5.6  NEUTROABS 2,828  HGB  14.0  HCT 43.7  MCV 85.7  PLT 362   Lab Results  Component Value Date   TSH 3.08 09/08/2023   No results found for: "HGBA1C" Lab Results  Component Value Date   CHOL 231 (H) 09/08/2023   HDL 60 09/08/2023   LDLCALC 146 (H) 09/08/2023   TRIG 128 09/08/2023   CHOLHDL 3.9 09/08/2023    Significant Diagnostic Results in last 30 days:  No results found.  Assessment/Plan  Cervical cancer screening No discharge or bleeding noted Tolerated procedure well.Chaperone Demetri Weaver  present throughout the procedure - PAP, IG w/ Reflex HPV when ASCU [[facility]]  Family/ staff Communication: Reviewed plan of care with patient verbalized understanding  Labs/tests ordered:  - PAP, IG w/ Reflex HPV when ASCU [[facility]]  Next Appointment: Return if symptoms worsen or fail to improve.   Caesar Bookman, NP

## 2023-09-29 LAB — PAP IG W/ RFLX HPV ASCU

## 2023-10-01 ENCOUNTER — Encounter: Payer: Commercial Managed Care - PPO | Admitting: Family

## 2023-10-03 NOTE — Progress Notes (Signed)
  This encounter was created in error - please disregard. No show 

## 2023-10-06 DIAGNOSIS — L258 Unspecified contact dermatitis due to other agents: Secondary | ICD-10-CM | POA: Diagnosis not present

## 2023-10-15 DIAGNOSIS — H52222 Regular astigmatism, left eye: Secondary | ICD-10-CM | POA: Diagnosis not present

## 2023-10-15 DIAGNOSIS — H5213 Myopia, bilateral: Secondary | ICD-10-CM | POA: Diagnosis not present

## 2023-10-15 DIAGNOSIS — H524 Presbyopia: Secondary | ICD-10-CM | POA: Diagnosis not present

## 2023-11-12 ENCOUNTER — Ambulatory Visit: Payer: Commercial Managed Care - PPO | Admitting: Nurse Practitioner

## 2023-11-12 ENCOUNTER — Encounter: Payer: Self-pay | Admitting: Nurse Practitioner

## 2023-11-12 VITALS — BP 130/80 | HR 80 | Temp 98.0°F | Resp 20 | Ht 68.0 in | Wt 256.4 lb

## 2023-11-12 DIAGNOSIS — M25562 Pain in left knee: Secondary | ICD-10-CM

## 2023-11-12 NOTE — Assessment & Plan Note (Addendum)
c/o left knee pain after flight to [place], feels warm left knee, had [place] and labs-unremarkable 11/10/23, had blood thinner on the way fly back, pain is better, only in the back of the left knee(was throbbing from he left knee to the LLE, now is back of knee pain with stretching the left leg/knee). CBC/diff, CMP/eGFR, Hgb A1c, uric acid, ESR, CRP, US venous left leg.

## 2023-11-12 NOTE — Progress Notes (Signed)
Location:   Clinic [facility]   Place of Service:    Provider: Chipper Oman NP  Code Status: DNR Goals of Care:     11/12/2023    2:08 PM  Advanced Directives  Does Patient Have a Medical Advance Directive? No  Would patient like information on creating a medical advance directive? No - Patient declined     Chief Complaint  Patient presents with   Knee Pain    Acute visit    HPI: Patient is a 57 y.o. female seen today for c/o left knee pain after flight to [place], feels warm left knee, had [place] and labs-unremarkable 11/10/23, had blood thinner on the way fly back, pain is better, only in the back of the left knee(was throbbing from he left knee to the LLE, now is back of knee pain with stretching the left leg/knee).    HLD, LDL 146 09/08/23, taking Omega 3   Past Medical History:  Diagnosis Date   Hyperlipidemia     Past Surgical History:  Procedure Laterality Date   childbirth  1982   1986, 1998   COLONOSCOPY  10/2020   DILATION AND CURETTAGE OF UTERUS      No Known Allergies  Allergies as of 11/12/2023   No Known Allergies      Medication List        Accurate as of November 12, 2023  4:19 PM. If you have any questions, ask your nurse or doctor.          cholecalciferol 1000 units tablet Commonly known as: VITAMIN D Take 1,000 Units by mouth daily.   COD LIVER OIL PO Take by mouth.   EVENING PRIMROSE OIL PO Take by mouth.   Fish Oil 1000 MG Caps Take by mouth.   ibuprofen 800 MG tablet Commonly known as: ADVIL Take 800 mg by mouth every 8 (eight) hours as needed.   loratadine 10 MG tablet Commonly known as: CLARITIN Take 10 mg by mouth daily as needed for allergies.   multivitamin capsule Take 1 capsule by mouth daily.   vitamin C 100 MG tablet Take 100 mg by mouth daily.        Review of Systems:  Review of Systems  Constitutional:  Negative for appetite change, fatigue and fever.  HENT:  Negative for congestion and trouble  swallowing.   Respiratory:  Negative for cough and wheezing.   Cardiovascular:  Negative for leg swelling.  Gastrointestinal:  Negative for abdominal pain and constipation.  Genitourinary:  Negative for frequency and urgency.  Musculoskeletal:  Positive for arthralgias, gait problem and joint swelling.       Left knee pain  Skin:  Negative for color change.  Neurological:  Negative for speech difficulty, weakness and headaches.  Psychiatric/Behavioral:  Negative for confusion and sleep disturbance. The patient is not nervous/anxious.     Health Maintenance  Topic Date Due   DTaP/Tdap/Td (1 - Tdap) Never done   Colonoscopy  Never done   Zoster Vaccines- Shingrix (1 of 2) Never done   COVID-19 Vaccine (4 - 2024-25 season) 07/25/2023   MAMMOGRAM  04/05/2025   Cervical Cancer Screening (HPV/Pap Cotest)  09/23/2026   INFLUENZA VACCINE  Completed   Hepatitis C Screening  Completed   HIV Screening  Completed   HPV VACCINES  Aged Out    Physical Exam: Vitals:   11/12/23 1411  BP: 130/80  Pulse: 80  Resp: 20  Temp: 98 F (36.7 C)  SpO2: 96%  Weight: 256 lb 6.4 oz (116.3 kg)  Height: 5\' 8"  (1.727 m)   Body mass index is 38.99 kg/m. Physical Exam Vitals and nursing note reviewed.  Constitutional:      Appearance: Normal appearance.  HENT:     Head: Normocephalic and atraumatic.     Nose: Congestion present.     Mouth/Throat:     Mouth: Mucous membranes are moist.  Eyes:     Extraocular Movements: Extraocular movements intact.     Conjunctiva/sclera: Conjunctivae normal.     Pupils: Pupils are equal, round, and reactive to light.  Cardiovascular:     Rate and Rhythm: Normal rate and regular rhythm.     Heart sounds: No murmur heard. Pulmonary:     Effort: Pulmonary effort is normal.     Breath sounds: No wheezing, rhonchi or rales.  Abdominal:     General: Bowel sounds are normal.     Palpations: Abdomen is soft.     Tenderness: There is no abdominal tenderness.   Musculoskeletal:        General: Swelling and tenderness present. No deformity.     Cervical back: Normal range of motion and neck supple.     Right lower leg: No edema.     Left lower leg: No edema.     Comments: Back of the left knee pain with ROM, feels warm, slightly swelling.   Skin:    General: Skin is warm and dry.  Neurological:     General: No focal deficit present.     Mental Status: She is alert and oriented to person, place, and time. Mental status is at baseline.     Gait: Gait normal.  Psychiatric:        Mood and Affect: Mood normal.        Behavior: Behavior normal.        Thought Content: Thought content normal.        Judgment: Judgment normal.     Labs reviewed: Basic Metabolic Panel: Recent Labs    09/08/23 0807  NA 138  K 4.3  CL 104  CO2 24  GLUCOSE 108*  BUN 17  CREATININE 0.93  CALCIUM 9.2  TSH 3.08   Liver Function Tests: Recent Labs    09/08/23 0807  AST 16  ALT 16  BILITOT 0.4  PROT 7.1   No results for input(s): "LIPASE", "AMYLASE" in the last 8760 hours. No results for input(s): "AMMONIA" in the last 8760 hours. CBC: Recent Labs    09/08/23 0807  WBC 5.6  NEUTROABS 2,828  HGB 14.0  HCT 43.7  MCV 85.7  PLT 362   Lipid Panel: Recent Labs    09/08/23 0807  CHOL 231*  HDL 60  LDLCALC 146*  TRIG 128  CHOLHDL 3.9   No results found for: "HGBA1C"  Procedures since last visit: No results found.  Assessment/Plan  Left knee pain c/o left knee pain after flight to [place], feels warm left knee, had [place] and labs-unremarkable 11/10/23, had blood thinner on the way fly back, pain is better, only in the back of the left knee(was throbbing from he left knee to the LLE, now is back of knee pain with stretching the left leg/knee). CBC/diff, CMP/eGFR, Hgb A1c, uric acid, ESR, CRP, US venous left leg.    Labs/tests ordered: CBC/diff, CMP/eGFR, Hgb A1c, uric acid, ESR, CRP, US venous left leg.   Next appt:  prn

## 2023-11-13 LAB — CBC WITH DIFFERENTIAL/PLATELET
Absolute Lymphocytes: 2552 {cells}/uL (ref 850–3900)
Absolute Monocytes: 400 {cells}/uL (ref 200–950)
Basophils Absolute: 58 {cells}/uL (ref 0–200)
Basophils Relative: 1 %
Eosinophils Absolute: 139 {cells}/uL (ref 15–500)
Eosinophils Relative: 2.4 %
HCT: 40.6 % (ref 35.0–45.0)
Hemoglobin: 13.2 g/dL (ref 11.7–15.5)
MCH: 27.6 pg (ref 27.0–33.0)
MCHC: 32.5 g/dL (ref 32.0–36.0)
MCV: 84.8 fL (ref 80.0–100.0)
MPV: 10.7 fL (ref 7.5–12.5)
Monocytes Relative: 6.9 %
Neutro Abs: 2651 {cells}/uL (ref 1500–7800)
Neutrophils Relative %: 45.7 %
Platelets: 332 10*3/uL (ref 140–400)
RBC: 4.79 10*6/uL (ref 3.80–5.10)
RDW: 12.6 % (ref 11.0–15.0)
Total Lymphocyte: 44 %
WBC: 5.8 10*3/uL (ref 3.8–10.8)

## 2023-11-13 LAB — COMPLETE METABOLIC PANEL WITH GFR
AG Ratio: 1.6 (calc) (ref 1.0–2.5)
ALT: 21 U/L (ref 6–29)
AST: 19 U/L (ref 10–35)
Albumin: 4.3 g/dL (ref 3.6–5.1)
Alkaline phosphatase (APISO): 85 U/L (ref 37–153)
BUN: 12 mg/dL (ref 7–25)
CO2: 30 mmol/L (ref 20–32)
Calcium: 9.7 mg/dL (ref 8.6–10.4)
Chloride: 106 mmol/L (ref 98–110)
Creat: 0.95 mg/dL (ref 0.50–1.03)
Globulin: 2.7 g/dL (ref 1.9–3.7)
Glucose, Bld: 82 mg/dL (ref 65–139)
Potassium: 4.3 mmol/L (ref 3.5–5.3)
Sodium: 143 mmol/L (ref 135–146)
Total Bilirubin: 0.3 mg/dL (ref 0.2–1.2)
Total Protein: 7 g/dL (ref 6.1–8.1)
eGFR: 70 mL/min/{1.73_m2} (ref >=60)

## 2023-11-13 LAB — SEDIMENTATION RATE: Sed Rate: 22 mm/h (ref 0–30)

## 2023-11-13 LAB — HEMOGLOBIN A1C
Hgb A1c MFr Bld: 6.7 %{Hb} — ABNORMAL HIGH (ref <5.7)
Mean Plasma Glucose: 146 mg/dL
eAG (mmol/L): 8.1 mmol/L

## 2023-11-13 LAB — C-REACTIVE PROTEIN: CRP: 4.1 mg/L (ref <8.0)

## 2023-11-13 LAB — URIC ACID: Uric Acid, Serum: 5.1 mg/dL (ref 2.5–7.0)

## 2023-11-18 ENCOUNTER — Other Ambulatory Visit: Payer: Commercial Managed Care - PPO

## 2023-11-23 ENCOUNTER — Encounter: Payer: Self-pay | Admitting: Family

## 2023-11-23 ENCOUNTER — Ambulatory Visit: Payer: Commercial Managed Care - PPO | Admitting: Family

## 2023-11-23 ENCOUNTER — Ambulatory Visit
Admission: RE | Admit: 2023-11-23 | Discharge: 2023-11-23 | Disposition: A | Discharge status: Home / Self Care | Payer: Commercial Managed Care - PPO | Source: Ambulatory Visit | Attending: Family | Admitting: Family

## 2023-11-23 VITALS — BP 116/70 | HR 78 | Temp 97.8°F | Resp 20 | Ht 68.0 in | Wt 255.0 lb

## 2023-11-23 DIAGNOSIS — E1100 Type 2 diabetes mellitus with hyperosmolarity without nonketotic hyperglycemic-hyperosmolar coma (NKHHC): Secondary | ICD-10-CM

## 2023-11-23 DIAGNOSIS — M25562 Pain in left knee: Secondary | ICD-10-CM

## 2023-11-23 DIAGNOSIS — E782 Mixed hyperlipidemia: Secondary | ICD-10-CM

## 2023-11-23 DIAGNOSIS — M1712 Unilateral primary osteoarthritis, left knee: Secondary | ICD-10-CM | POA: Diagnosis not present

## 2023-11-23 MED ORDER — IBUPROFEN 800 MG PO TABS: 800.0000 mg | ORAL_TABLET | Freq: Three times a day (TID) | ORAL | 0 refills | Status: DC | PRN

## 2023-11-23 NOTE — Progress Notes (Signed)
 Provider: Roxan Plough FNP-C  Margreat Widener, Roxan BROCKS, NP  Patient Care Team: Edy Belt, Roxan BROCKS, NP as PCP - General (Family Medicine)  Extended Emergency Contact Information Primary Emergency Contact: Cox,Denicia  [place] Mobile Phone: [phone] Relation: Daughter  Code Status: Full code Goals of care: Advanced Directive information    11/12/2023    2:08 PM  Advanced Directives  Does Patient Have a Medical Advance Directive? No  Would patient like information on creating a medical advance directive? No - Patient declined     Chief Complaint  Patient presents with   Acute Visit    Patient presents today for left knee pain since 10/29/23.    HPI:  Pt is a 57 y.o. female seen today for an acute visit for evaluation of left knee pain.  States pain worse with weightbearing.  States no radiation to lower extremity. Has had left knee pain since 10/29/2023 after travelling to [place].Had a scan done to r/o DVT but was negative. She was given blood thinner injection to use when travelling back.last dose 11/08/2022.Has been limbing and trying to walk normal. She denies any weakness or numbness or tingling on the lower extremity.  Denies any injuries to knee.  Recent lab results reviewed and discussed with patient.  Her hemoglobin A1c was 6.7 notify patient that this indicates type 2 diabetes but patient states does not want to claim diabetes.  States diabetes runs in the family but she will not accept that she has diabetes.  Declines to start on diabetic medication. LDL also elevated 146 also declines to start on medications that will change her diet.  Past Medical History:  Diagnosis Date   Hyperlipidemia    Past Surgical History:  Procedure Laterality Date   childbirth  1982   1986, 1998   COLONOSCOPY  10/2020   DILATION AND CURETTAGE OF UTERUS      No Known Allergies  Outpatient Encounter Medications as of 11/23/2023  Medication Sig   Ascorbic Acid  (VITAMIN C) 100 MG tablet Take 100 mg by mouth daily.   cholecalciferol (VITAMIN D) 1000 units tablet Take 1,000 Units by mouth daily.   COD LIVER OIL PO Take by mouth.   EVENING PRIMROSE OIL PO Take by mouth.   ibuprofen  (ADVIL ,MOTRIN ) 800 MG tablet Take 800 mg by mouth every 8 (eight) hours as needed.   loratadine (CLARITIN) 10 MG tablet Take 10 mg by mouth daily as needed for allergies.   Multiple Vitamin (MULTIVITAMIN) capsule Take 1 capsule by mouth daily.   Omega-3 Fatty Acids (FISH OIL) 1000 MG CAPS Take by mouth.   No facility-administered encounter medications on file as of 11/23/2023.    Review of Systems  Constitutional:  Negative for appetite change, chills, fatigue, fever and unexpected weight change.  Respiratory:  Negative for cough, chest tightness, shortness of breath and wheezing.   Cardiovascular:  Negative for chest pain, palpitations and leg swelling.  Gastrointestinal:  Negative for abdominal distention.  Musculoskeletal:  Positive for arthralgias. Negative for back pain, gait problem, joint swelling, myalgias, neck pain and neck stiffness.       Left knee pain  Skin:  Negative for color change, pallor, rash and wound.  Neurological:  Negative for dizziness, weakness, light-headedness, numbness and headaches.    Immunization History  Administered Date(s) Administered   Influenza Split 09/11/2016   Influenza,inj,Quad PF,6+ Mos 08/29/2021   Influenza-Unspecified 08/19/2023   PFIZER(Purple Top)SARS-COV-2 Vaccination 02/03/2020, 02/24/2020, 10/31/2020   Pertinent  Health Maintenance Due  Topic Date Due   Colonoscopy  Never done   MAMMOGRAM  04/05/2025   INFLUENZA VACCINE  Completed      09/07/2023   12:58 PM 09/24/2023    2:20 PM 11/12/2023    2:05 PM  Fall Risk  Falls in the past year? 1 0 0  Was there an injury with Fall? 0 0 0  Fall Risk Category Calculator 2  0  Patient at Risk for Falls Due to No Fall Risks No Fall Risks   Fall risk Follow up Falls  evaluation completed     Functional Status Survey:    Vitals:   11/23/23 1026  BP: 116/70  Pulse: 78  Resp: 20  Temp: 97.8 F (36.6 C)  SpO2: 98%  Weight: 255 lb (115.7 kg)  Height: 5' 8 (1.727 m)   Body mass index is 38.77 kg/m. Physical Exam Vitals reviewed.  Constitutional:      General: She is not in acute distress.    Appearance: Normal appearance. She is obese. She is not ill-appearing or diaphoretic.  HENT:     Head: Normocephalic.  Eyes:     General: No scleral icterus.       Right eye: No discharge.        Left eye: No discharge.     Conjunctiva/sclera: Conjunctivae normal.     Pupils: Pupils are equal, round, and reactive to light.  Neck:     Vascular: No carotid bruit.  Cardiovascular:     Rate and Rhythm: Normal rate and regular rhythm.     Pulses: Normal pulses.     Heart sounds: Normal heart sounds. No murmur heard.    No friction rub. No gallop.  Pulmonary:     Effort: Pulmonary effort is normal. No respiratory distress.     Breath sounds: Normal breath sounds. No wheezing, rhonchi or rales.  Chest:     Chest wall: No tenderness.  Musculoskeletal:        General: No swelling.     Cervical back: Normal range of motion. No rigidity or tenderness.     Right knee: Normal.     Left knee: Swelling and effusion present. No erythema, ecchymosis or crepitus. Decreased range of motion. Tenderness present.     Right lower leg: No edema.     Left lower leg: No edema.  Lymphadenopathy:     Cervical: No cervical adenopathy.  Skin:    General: Skin is warm and dry.     Coloration: Skin is not pale.     Findings: No bruising, erythema or rash.  Neurological:     Mental Status: She is alert and oriented to person, place, and time.     Sensory: No sensory deficit.     Motor: No weakness.     Gait: Gait normal.  Psychiatric:        Mood and Affect: Mood normal.        Speech: Speech normal.        Behavior: Behavior normal.     Labs reviewed: Recent  Labs    09/08/23 0807 11/12/23 1440  NA 138 143  K 4.3 4.3  CL 104 106  CO2 24 30  GLUCOSE 108* 82  BUN 17 12  CREATININE 0.93 0.95  CALCIUM 9.2 9.7   Recent Labs    09/08/23 0807 11/12/23 1440  AST 16 19  ALT 16 21  BILITOT 0.4 0.3  PROT 7.1 7.0   Recent Labs    09/08/23 0807  11/12/23 1440  WBC 5.6 5.8  NEUTROABS 2,828 2,651  HGB 14.0 13.2  HCT 43.7 40.6  MCV 85.7 84.8  PLT 362 332   Lab Results  Component Value Date   TSH 3.08 09/08/2023   Lab Results  Component Value Date   HGBA1C 6.7 (H) 11/12/2023   Lab Results  Component Value Date   CHOL 231 (H) 09/08/2023   HDL 60 09/08/2023   LDLCALC 146 (H) 09/08/2023   TRIG 128 09/08/2023   CHOLHDL 3.9 09/08/2023    Significant Diagnostic Results in last 30 days:  No results found.  Assessment/Plan 1. Acute pain of left knee (Primary) Slightly swollen compared to right knee with effusion but without any ecchymosis or erythema.  -Will obtain imaging and refer to orthopedic - DG Knee Complete 4 Views Left; Future - ibuprofen  (ADVIL ) 800 MG tablet; Take 1 tablet (800 mg total) by mouth every 8 (eight) hours as needed.  Dispense: 30 tablet; Refill: 0  2. Type 2 diabetes mellitus with hyperosmolarity without coma, without long-term current use of insulin (HCC) Lab Results  Component Value Date   HGBA1C 6.7 (H) 11/12/2023  -Declines to start on diabetic medication tells me won't claim diabetes in the name of Jesus  though it runs in the family.  Would like to change her diet.  Recommend referral to diabetic nutritionist for diabetic education.  Agrees for referral - Ambulatory referral to diabetic education - TSH; Future - COMPLETE METABOLIC PANEL WITH GFR; Future - CBC with Differential/Platelet; Future - Hemoglobin A1c; Future  3. Mixed hyperlipidemia LDL 146 would like to normal levels with dietary modification.  Exercise but limited due to left knee pain. - Lipid panel; Future  Family/ staff  Communication: Reviewed plan of care with patient verbalized understanding  Labs/tests ordered: - TSH; Future - COMPLETE METABOLIC PANEL WITH GFR; Future - CBC with Differential/Platelet; Future - Hemoglobin A1c; Future - Lipid panel; Future  Next Appointment: Return in about 4 months (around 03/22/2024) for medical mangement of chronic issues., fasting labs prior to visit.   Roxan JAYSON Plough, NP

## 2023-11-23 NOTE — Patient Instructions (Signed)
-   Please get knee X-ray at [facility] imaging at [facility] then will call you with results.

## 2023-11-29 ENCOUNTER — Encounter: Payer: Commercial Managed Care - PPO | Attending: Family | Admitting: Skilled Nursing Facility1

## 2023-11-29 ENCOUNTER — Other Ambulatory Visit: Payer: Self-pay | Admitting: Family

## 2023-11-29 ENCOUNTER — Encounter: Payer: Self-pay | Admitting: Skilled Nursing Facility1

## 2023-11-29 VITALS — Ht 68.5 in | Wt 255.3 lb

## 2023-11-29 DIAGNOSIS — E119 Type 2 diabetes mellitus without complications: Secondary | ICD-10-CM | POA: Insufficient documentation

## 2023-11-29 DIAGNOSIS — M25562 Pain in left knee: Secondary | ICD-10-CM

## 2023-11-29 DIAGNOSIS — E1100 Type 2 diabetes mellitus with hyperosmolarity without nonketotic hyperglycemic-hyperosmolar coma (NKHHC): Secondary | ICD-10-CM

## 2023-11-29 DIAGNOSIS — M1712 Unilateral primary osteoarthritis, left knee: Secondary | ICD-10-CM

## 2023-11-29 MED ORDER — METFORMIN HCL 500 MG PO TABS: 500.0000 mg | ORAL_TABLET | Freq: Every day | ORAL | 1 refills | Status: DC

## 2023-11-29 NOTE — Progress Notes (Signed)
 Pt states her goals are to lose weight and control her DM.   Pt states she is [employer] employee.  Diabetes Self-Management Education  Visit Type: First/Initial  Appt. Start Time: 4:03 Appt. End Time: 5:06  11/30/2023  Tanya Bowen, identified by name and date of birth, is a 58 y.o. female with a diagnosis of Diabetes: Type 2.   ASSESSMENT  Height 5' 8.5 (1.74 m), weight 255 lb 4.8 oz (115.8 kg), last menstrual period 06/08/2018. Body mass index is 38.25 kg/m.   Diabetes Self-Management Education - 11/29/23 1609       Visit Information   Visit Type First/Initial      Initial Visit   Diabetes Type Type 2    Are you currently following a meal plan? No    Are you taking your medications as prescribed? Not on Medications      Health Coping   How would you rate your overall health? Good      Psychosocial Assessment   Patient Belief/Attitude about Diabetes Motivated to manage diabetes    What is the hardest part about your diabetes right now, causing you the most concern, or is the most worrisome to you about your diabetes?   Being active;Making healty food and beverage choices    Self-care barriers None    Self-management support None    Patient Concerns Glycemic Control;Nutrition/Meal planning;Weight Control    Special Needs None    Preferred Learning Style Visual;Auditory    Learning Readiness Contemplating    How often do you need to have someone help you when you read instructions, pamphlets, or other written materials from your doctor or pharmacy? 1 - Never      Pre-Education Assessment   Patient understands the diabetes disease and treatment process. Needs Instruction    Patient understands incorporating nutritional management into lifestyle. Needs Instruction    Patient undertands incorporating physical activity into lifestyle. Needs Instruction    Patient understands using medications safely. Needs Instruction    Patient understands monitoring blood glucose,  interpreting and using results Needs Instruction    Patient understands prevention, detection, and treatment of acute complications. Needs Instruction    Patient understands prevention, detection, and treatment of chronic complications. Needs Instruction    Patient understands how to develop strategies to address psychosocial issues. Needs Instruction    Patient understands how to develop strategies to promote health/change behavior. Needs Instruction      Complications   Last HgB A1C per patient/outside source 6.7 %    How often do you check your blood sugar? 0 times/day (not testing)    Have you had a dilated eye exam in the past 12 months? Yes    Have you had a dental exam in the past 12 months? No    Are you checking your feet? No      Dietary Intake   Breakfast wakes at 5am: water, coffee + sugar free creamer + tumeric + cinn + ginger    Lunch 12pm: meat and salad or rice and vegetables: mushrooms, broccoli, kale, mixed vegetables, pepper, onion + fish or chicken or half sandwich + fruit    Snack (afternoon) fruit, nuts    Dinner leftovers from lunch    Snack (evening) tea + no sugar    Beverage(s) water, coffee      Activity / Exercise   Activity / Exercise Type ADL's    How many days per week do you exercise? 15    How many minutes per  day do you exercise? 6    Total minutes per week of exercise 90      Patient Education   Previous Diabetes Education No    Disease Pathophysiology Definition of diabetes, type 1 and 2, and the diagnosis of diabetes;Factors that contribute to the development of diabetes    Healthy Eating Plate Method;Carbohydrate counting;Role of diet in the treatment of diabetes and the relationship between the three main macronutrients and blood glucose level;Information on hints to eating out and maintain blood glucose control.;Meal options for control of blood glucose level and chronic complications.    Being Active Helped patient identify appropriate exercises  in relation to his/her diabetes, diabetes complications and other health issue.;Role of exercise on diabetes management, blood pressure control and cardiac health.    Medications Reviewed patients medication for diabetes, action, purpose, timing of dose and side effects.    Monitoring Taught/evaluated SMBG meter.;Purpose and frequency of SMBG.;Daily foot exams    Acute complications Taught prevention, symptoms, and  treatment of hypoglycemia - the 15 rule.;Discussed and identified patients' prevention, symptoms, and treatment of hyperglycemia.    Chronic complications Dental care;Retinopathy and reason for yearly dilated eye exams;Nephropathy, what it is, prevention of, the use of ACE, ARB's and early detection of through urine microalbumia.;Identified and discussed with patient  current chronic complications;Assessed and discussed foot care and prevention of foot problems    Diabetes Stress and Support Role of stress on diabetes      Individualized Goals (developed by patient)   Nutrition Follow meal plan discussed;General guidelines for healthy choices and portions discussed    Physical Activity Exercise 1-2 times per week;15 minutes per day;30 minutes per day    Medications take my medication as prescribed    Monitoring  Test my blood glucose as discussed;Test blood glucose pre and post meals as discussed    Problem Solving Eating Pattern      Post-Education Assessment   Patient understands the diabetes disease and treatment process. Demonstrates understanding / competency    Patient understands incorporating nutritional management into lifestyle. Demonstrates understanding / competency    Patient undertands incorporating physical activity into lifestyle. Demonstrates understanding / competency    Patient understands using medications safely. Demonstrates understanding / competency    Patient understands monitoring blood glucose, interpreting and using results Demonstrates understanding /  competency    Patient understands prevention, detection, and treatment of acute complications. Demonstrates understanding / competency    Patient understands prevention, detection, and treatment of chronic complications. Demonstrates understanding / competency    Patient understands how to develop strategies to address psychosocial issues. Demonstrates understanding / competency    Patient understands how to develop strategies to promote health/change behavior. Demonstrates understanding / competency      Outcomes   Expected Outcomes Demonstrated interest in learning. Expect positive outcomes    Future DMSE 3-4 months    Program Status Completed             Individualized Plan for Diabetes Self-Management Training:   Learning Objective:  Patient will have a greater understanding of diabetes self-management. Patient education plan is to attend individual and/or group sessions per assessed needs and concerns.    Expected Outcomes:  Demonstrated interest in learning. Expect positive outcomes  Education material provided: Bowen - How to Thrive: A Guide for Your Journey with Diabetes, Food label handouts, A1C conversion sheet, Meal plan card, and My Plate  If problems or questions, patient to contact team via:  Phone and Email  Future DSME appointment: 3-4 months

## 2023-12-03 ENCOUNTER — Ambulatory Visit: Payer: Commercial Managed Care - PPO | Admitting: Orthopaedic Surgery

## 2023-12-03 VITALS — BP 130/88 | HR 98

## 2023-12-03 DIAGNOSIS — M25562 Pain in left knee: Secondary | ICD-10-CM

## 2023-12-03 NOTE — Progress Notes (Signed)
 Office Visit Note   Patient: Tanya Bowen           Date of Birth: [birth year]           MRN: [id number] Visit Date: 12/03/2023              Requested by: Leonarda Roxan BROCKS, NP [address] [place],  [place] [address] PCP: Leonarda Roxan BROCKS, NP   Assessment & Plan: Visit Diagnoses:  1. Acute pain of left knee     Plan: We discussed options versus injection and proceed with intra-articular injection she can walk much better with only trace limp after the injection.  She can apply some ice and I will recheck her in 3 weeks.  If she has persistent symptoms we can consider imaging studies.    Follow-Up Instructions: No follow-ups on file.   Orders:  No orders of the defined types were placed in this encounter.  No orders of the defined types were placed in this encounter.     Procedures: No procedures performed   Clinical Data: No additional findings.   Subjective: No chief complaint on file.   HPI 58 year old female flying back from [place] December 5 when she got out she had increased pain and swelling in her knee.  She had a Doppler test negative for DVT.  Difficulty extending her knee she has been walking with a limp now starting to have some back pain associated with her altered gait.  No past history of injury to her knee.  She did have radiographs which showed mild tricompartmental osteoarthritis with tiny spurs and slight joint space narrowing.  She notes some swelling in her knee and is still walking with a slightly bent knee gait.  No problems with the right knee.  Patient works as a systems developer at [employer].  Review of Systems recent diagnosis A1c 6.7 started on metformin .  Uric acid normal.  All systems noncontributory to HPI.   Objective: Vital Signs: BP 130/88   Pulse 98   LMP 06/08/2018 (Exact Date)   Physical Exam Constitutional:      Appearance: She is well-developed.  HENT:     Head: Normocephalic.     Right Ear: External ear normal.      Left Ear: External ear normal. There is no impacted cerumen.  Eyes:     Pupils: Pupils are equal, round, and reactive to light.  Neck:     Thyroid: No thyromegaly.     Trachea: No tracheal deviation.  Cardiovascular:     Rate and Rhythm: Normal rate.  Pulmonary:     Effort: Pulmonary effort is normal.  Abdominal:     Palpations: Abdomen is soft.  Musculoskeletal:     Cervical back: No rigidity.  Skin:    General: Skin is warm and dry.  Neurological:     Mental Status: She is alert and oriented to person, place, and time.  Psychiatric:        Behavior: Behavior normal.     Ortho Exam flexion past 120 she does passively reach full extension pain with patellofemoral loading.  She ambulates with a slightly bent knee gait.  Negative logroll hips.  Some tenderness posterior medial joint line.  Specialty Comments:  No specialty comments available.  Imaging: No results found.   PMFS History: Patient Active Problem List   Diagnosis Date Noted   Left knee pain 11/12/2023   Mild dysplasia of cervix (CIN I) 03/05/2017   Past Medical History:  Diagnosis Date   Diabetes mellitus without complication (HCC)    Hyperlipidemia     Family History  Problem Relation Age of Onset   Diabetes Mother    Hypertension Mother    Diabetes Father    Stroke Brother    Stroke Maternal Grandfather    Cancer Paternal Aunt     Past Surgical History:  Procedure Laterality Date   childbirth  1982   1986, 1998   COLONOSCOPY  10/2020   DILATION AND CURETTAGE OF UTERUS     Social History   Occupational History   Not on file  Tobacco Use   Smoking status: Never   Smokeless tobacco: Never  Vaping Use   Vaping status: Never Used  Substance and Sexual Activity   Alcohol use: No   Drug use: No   Sexual activity: Yes    Birth control/protection: None

## 2024-01-11 ENCOUNTER — Encounter: Payer: Self-pay | Admitting: Orthopaedic Surgery

## 2024-01-11 ENCOUNTER — Ambulatory Visit: Payer: Commercial Managed Care - PPO | Admitting: Orthopaedic Surgery

## 2024-01-11 VITALS — BP 107/75 | HR 111

## 2024-01-11 DIAGNOSIS — M25562 Pain in left knee: Secondary | ICD-10-CM

## 2024-01-12 NOTE — Progress Notes (Signed)
Office Visit Note   Patient: Tanya Bowen           Date of Birth: 11-13-1966           MRN: [id number] Visit Date: 01/11/2024              Requested by: Caesar Bookman, NP [address] [place],  [place] [address] PCP: Caesar Bookman, NP   Assessment & Plan: Visit Diagnoses:  1. Acute pain of left knee     Plan: Will set her up for some physical therapy to work on range of motion and strengthening particular work on getting full extension.  She like to go to [facility] for this.  I will check her in 4 weeks.  Follow-Up Instructions: Return in about 4 weeks (around 02/08/2024).   Orders:  Orders Placed This Encounter  Procedures   Ambulatory referral to Physical Therapy   No orders of the defined types were placed in this encounter.     Procedures: No procedures performed   Clinical Data: No additional findings.   Subjective: Chief Complaint  Patient presents with   Left Knee - Pain, Follow-up    HPI 58 year old female with previous left knee injection 12/03/2023.  She states she still having problems with flexion injection helped her knee to some degree.  Cracks and pops whether when he gets raining or cold tends to make her knee hurt worse she has been using an elastic stretch knee brace which seems to help.  She is using Motrin without specific improvement.  She has not been through any therapy and has trouble reaching full extension and has been walking mostly with the knee slight flexed gait.  Review of Systems all other systems noncontributory to HPI positive for diabetes on metformin.   Objective: Vital Signs: BP 107/75   Pulse (!) 111   LMP 06/08/2018 (Exact Date)   Physical Exam Constitutional:      Appearance: She is well-developed.  HENT:     Head: Normocephalic.     Right Ear: External ear normal.     Left Ear: External ear normal. There is no impacted cerumen.  Eyes:     Pupils: Pupils are equal, round, and  reactive to light.  Neck:     Thyroid: No thyromegaly.     Trachea: No tracheal deviation.  Cardiovascular:     Rate and Rhythm: Normal rate.  Pulmonary:     Effort: Pulmonary effort is normal.  Abdominal:     Palpations: Abdomen is soft.  Musculoskeletal:     Cervical back: No rigidity.  Skin:    General: Skin is warm and dry.  Neurological:     Mental Status: She is alert and oriented to person, place, and time.  Psychiatric:        Behavior: Behavior normal.     Ortho Exam knee crepitus with knee range of motion.  Some pain with patellofemoral loading collateral ligaments ACL PCL is normal.  She ambulates with knee slight flexed gait.  Passively with some time she can come out to full extension.  Specialty Comments:  No specialty comments available.  Imaging: No results found.   PMFS History: Patient Active Problem List   Diagnosis Date Noted   Left knee pain 11/12/2023   Mild dysplasia of cervix (CIN I) 03/05/2017   Past Medical History:  Diagnosis Date   Diabetes mellitus without complication (HCC)    Hyperlipidemia     Family  History  Problem Relation Age of Onset   Diabetes Mother    Hypertension Mother    Diabetes Father    Stroke Brother    Stroke Maternal Grandfather    Cancer Paternal Aunt     Past Surgical History:  Procedure Laterality Date   childbirth  1982   1986, 1998   COLONOSCOPY  10/2020   DILATION AND CURETTAGE OF UTERUS     Social History   Occupational History   Not on file  Tobacco Use   Smoking status: Never   Smokeless tobacco: Never  Vaping Use   Vaping status: Never Used  Substance and Sexual Activity   Alcohol use: No   Drug use: No   Sexual activity: Yes    Birth control/protection: None

## 2024-01-17 ENCOUNTER — Other Ambulatory Visit: Payer: Self-pay | Admitting: *Deleted

## 2024-01-17 DIAGNOSIS — E1100 Type 2 diabetes mellitus with hyperosmolarity without nonketotic hyperglycemic-hyperosmolar coma (NKHHC): Secondary | ICD-10-CM

## 2024-01-17 MED ORDER — ACCU-CHEK AVIVA PLUS VI STRP: ORAL_STRIP | 1 refills | Status: DC

## 2024-01-17 MED ORDER — ACCU-CHEK FASTCLIX LANCETS MISC: 1 refills | Status: DC

## 2024-01-17 MED ORDER — ACCU-CHEK AVIVA PLUS W/DEVICE KIT: PACK | 0 refills | Status: DC

## 2024-01-17 MED ORDER — METFORMIN HCL 500 MG PO TABS: 500.0000 mg | ORAL_TABLET | Freq: Every day | ORAL | 1 refills | Status: DC

## 2024-01-17 NOTE — Telephone Encounter (Signed)
 Patient called and requested refills to be sent to [facility]. [place].

## 2024-01-18 ENCOUNTER — Other Ambulatory Visit: Payer: Self-pay | Admitting: Emergency Medicine

## 2024-01-18 ENCOUNTER — Telehealth: Payer: Self-pay | Admitting: Emergency Medicine

## 2024-01-18 DIAGNOSIS — E1100 Type 2 diabetes mellitus with hyperosmolarity without nonketotic hyperglycemic-hyperosmolar coma (NKHHC): Secondary | ICD-10-CM

## 2024-01-18 MED ORDER — ACCU-CHEK AVIVA PLUS VI STRP: ORAL_STRIP | 1 refills | Status: DC

## 2024-01-18 MED ORDER — ACCU-CHEK FASTCLIX LANCETS MISC: 1 refills | Status: DC

## 2024-01-18 MED ORDER — GLUCOSE BLOOD VI STRP: 1.0000 | ORAL_STRIP | Freq: Every day | 12 refills | Status: AC

## 2024-01-18 MED ORDER — ACCU-CHEK AVIVA PLUS W/DEVICE KIT: PACK | 0 refills | Status: DC

## 2024-01-18 MED ORDER — ONETOUCH ULTRA 2 W/DEVICE KIT: 1.0000 | PACK | Freq: Every day | 0 refills | Status: AC

## 2024-01-18 MED ORDER — ONETOUCH DELICA LANCETS 33G MISC: 1.0000 | Freq: Every day | 11 refills | Status: AC

## 2024-01-18 NOTE — Telephone Encounter (Signed)
(  Tanya Bowen) [facility] called because [employer] doesn't cover the glucose supplies that we sent and they are requesting [employer].Patient is aware that we are changing brands

## 2024-01-19 ENCOUNTER — Encounter: Payer: Self-pay | Admitting: Physical Therapy

## 2024-01-19 ENCOUNTER — Ambulatory Visit: Payer: Commercial Managed Care - PPO | Attending: Orthopaedic Surgery | Admitting: Physical Therapy

## 2024-01-19 ENCOUNTER — Other Ambulatory Visit: Payer: Self-pay

## 2024-01-19 DIAGNOSIS — R2689 Other abnormalities of gait and mobility: Secondary | ICD-10-CM | POA: Insufficient documentation

## 2024-01-19 DIAGNOSIS — G8929 Other chronic pain: Secondary | ICD-10-CM | POA: Insufficient documentation

## 2024-01-19 DIAGNOSIS — M25562 Pain in left knee: Secondary | ICD-10-CM | POA: Diagnosis not present

## 2024-01-19 DIAGNOSIS — M6281 Muscle weakness (generalized): Secondary | ICD-10-CM | POA: Insufficient documentation

## 2024-01-19 NOTE — Therapy (Incomplete)
 OUTPATIENT PHYSICAL THERAPY LOWER EXTREMITY EVALUATION   Patient Name: Tanya Bowen MRN: [id number] DOB:02-05-1966, 58 y.o., female Today's Date: 01/19/2024  END OF SESSION:   Past Medical History:  Diagnosis Date   Diabetes mellitus without complication (HCC)    Hyperlipidemia    Past Surgical History:  Procedure Laterality Date   childbirth  1982   1986, 1998   COLONOSCOPY  10/2020   DILATION AND CURETTAGE OF UTERUS     Patient Active Problem List   Diagnosis Date Noted   Left knee pain 11/12/2023   Mild dysplasia of cervix (CIN I) 03/05/2017    PCP: Caesar Bookman, NP  REFERRING PROVIDER: Eldred Manges, MD   REFERRING DIAG: [phone] (ICD-10-CM) - Acute pain of left knee   THERAPY DIAG:  No diagnosis found.  Rationale for Evaluation and Treatment: Rehabilitation  ONSET DATE: Chronic, last year  SUBJECTIVE:   SUBJECTIVE STATEMENT: Patient arrives at clinic with c/c of left knee pain and limited knee ROM especially in straightening. She states that last year she went to [place] and after the flight she felt a knee pain that has not gotten better. She was put on blood thinners after visiting the MD in [place] and cleared to come back to [place]. The same pain happened on the flight. She got injections in January which she feels like helped but she does not feel full relief. Starting this past Friday she got the pain again. Reports that she tried to go for a walk Thursday night and it could have created her pain. She notices after resting and keeping her knee in one position her pain gets much worse. She gets a lot of numbness and tingling and cramping in the left leg.   PERTINENT HISTORY: Diabetes mellitus  PAIN:  Are you having pain? Yes: NPRS scale: 5/10 right, 8/10 worst Pain location: right on knee cap, sometimes side  Pain description: constant Aggravating factors: sitting still Relieving factors: bracing, inflammation rub   PRECAUTIONS: None  RED  FLAGS: None   WEIGHT BEARING RESTRICTIONS: No  FALLS:  Has patient fallen in last 6 months? Yes. Number of falls tripped and hit her knee  LIVING ENVIRONMENT: Lives with: lives with their family Lives in: House/apartment Stairs: Yes  Has following equipment at home: None  OCCUPATION: Yes  PLOF: Independent  PATIENT GOALS: Return to full ROM in knee, be able to walk longer distances.   NEXT MD VISIT: A month  OBJECTIVE:  Note: Objective measures were completed at Evaluation unless otherwise noted.  DIAGNOSTIC FINDINGS:    1.No acute abnormality of the left knee. 2. Minimal tricompartmental osteoarthrosis.  PATIENT SURVEYS:  LEFS 39/80  COGNITION: Overall cognitive status: Within functional limits for tasks assessed    MUSCLE LENGTH: Hamstrings: Right - WNL, L - 50%   POSTURE: weight shift right  PALPATION: Tenderness to piriformis, hamstring, posterior and lateral knees, gastroc.   LOWER EXTREMITY ROM:  Active ROM Right eval Left eval  Hip flexion 4- 3  Hip extension    Hip abduction    Hip adduction    Hip internal rotation    Hip external rotation    Knee flexion 4- 3  Knee extension 4- 3  Ankle dorsiflexion 4 4-  Ankle plantarflexion 4 4-  Ankle inversion    Ankle eversion     (Blank rows = not tested)  LOWER EXTREMITY MMT:  MMT Right eval Left eval  Hip flexion    Hip extension  Hip abduction    Hip adduction    Hip internal rotation    Hip external rotation    Knee flexion 122 108  Knee extension -2 4  Ankle dorsiflexion    Ankle plantarflexion    Ankle inversion    Ankle eversion     (Blank rows = not tested)  LOWER EXTREMITY SPECIAL TESTS:  SLR: positive for neural tension  FUNCTIONAL TESTS:  30 seconds chair stand test: 8 sit to stands with hand support   GAIT: Distance walked: 40 Assistive device utilized: None Level of assistance: Complete Independence Comments: antalgic gait,weight shift right, limited hip  flexion, limited knee extension                                                                                          TREATMENT DATE:    PATIENT EDUCATION:  Education details: Exam findings, POC, HEP.  Person educated: Patient Education method: Explanation, Demonstration, Tactile cues, and Handouts Education comprehension: verbalized understanding, returned demonstration, tactile cues required, and needs further education  HOME EXERCISE PROGRAM: Access: [email or web address]  ASSESSMENT:  CLINICAL IMPRESSION: Patient is a 58 y.o. female who was seen today for physical therapy evaluation and treatment for c/c of left knee, hip, and lower back pain. Patient reports the pain has been occurring for over a year since she traveled to [place] and got knee pain after sitting on the plane. She got a cortisone injection in January which she reports helped her some.   OBJECTIVE IMPAIRMENTS: Abnormal gait, decreased activity tolerance, difficulty walking, decreased ROM, decreased strength, and pain.   ACTIVITY LIMITATIONS: bending, sitting, standing, squatting, sleeping, and locomotion level  PARTICIPATION LIMITATIONS: community activity and occupation  PERSONAL FACTORS: Time since onset of injury/illness/exacerbation and 1 comorbidity: see PMH  are also affecting patient's functional outcome.   REHAB POTENTIAL: Good  CLINICAL DECISION MAKING: Evolving/moderate complexity  EVALUATION COMPLEXITY: Moderate   GOALS: Goals reviewed with patient? Yes  SHORT TERM GOALS: Target date: 02/16/2024  Patient will be I with initial HEP in order to progress with therapy. Baseline: Goal status: INITIAL  2.  Patient will score </= 6/10 at worse on the NPS to show an improvement in pain allowing for an increase in functional ability.  Baseline: 8/10 Goal status: INITIAL   LONG TERM GOALS: Target date: 03/15/2024  Patient will be independent with final HEP in order to continue treatment at home for pain  tolerance.  Baseline:  Goal status: INITIAL  2.  Patient will score </= 4/10 at worse on the NPS to show an improvement in pain allowing for an increase in functional ability.  Baseline: 8/10 Goal status: INITIAL  3.  Patient will score a 49/80 on the LEFS in order to show an improvement in function according to MCID guidelines. Baseline: 39/80 Goal status: INITIAL  4.  Patient will increase left LE strength to a 4/5 to show an increase in LE strength to improve function  Baseline:  Goal status: INITIAL PLAN:  PT FREQUENCY: 2x/week  PT DURATION: 8 weeks  PLANNED INTERVENTIONS: 97164- PT Re-evaluation, 97110-Therapeutic exercises, 97530- Therapeutic activity, [id number]- Neuromuscular re-education, 97535- Self Care, 16109-  Manual therapy, [phone]- Gait training, Patient/Family education, Balance training, Stair training, Dry Needling, Joint mobilization, Joint manipulation, Spinal manipulation, Spinal mobilization, Moist heat, and Biofeedback  PLAN FOR NEXT SESSION: review/revise HEP.    Erin Hearing, Student-PT 01/19/2024, 4:16 PM

## 2024-01-20 NOTE — Patient Instructions (Signed)
 Access Code: [email or web address] URL: [email or web address] Date: 01/20/2024 Prepared by: Rosana Hoes  Exercises - Supine Piriformis Stretch with Foot on Ground  - 2 x daily - 3 reps - 30 seconds hold - Long Sitting Quad Set with Towel Roll Under Heel  - 2 x daily - 2 sets - 10 reps - 3 seconds hold - Modified Thomas Stretch  - 2 x daily - 3 reps - 60 seconds hold - Seated Hamstring Stretch  - 2 x daily - 3 reps - 30 seconds hold

## 2024-01-24 ENCOUNTER — Telehealth: Payer: Self-pay | Admitting: *Deleted

## 2024-01-24 ENCOUNTER — Ambulatory Visit: Payer: Commercial Managed Care - PPO | Admitting: Skilled Nursing Facility1

## 2024-01-24 NOTE — Telephone Encounter (Signed)
 May discontinue Metformin due to diarrhea.Please schedule an appointment to discuss another alternative medication for diabetes.

## 2024-01-24 NOTE — Telephone Encounter (Signed)
 Patient called and stated that the Metformin prescribed is messing up her stomach. Stated that she is having an upset stomach, weakness and diarrhea and had to stay home 2 days last week from work due to the symptoms.   Patient is wondering if she can try something different  Please Advise.

## 2024-01-25 ENCOUNTER — Encounter: Payer: Self-pay | Admitting: Physical Therapy

## 2024-01-25 ENCOUNTER — Ambulatory Visit: Payer: Commercial Managed Care - PPO | Attending: Orthopaedic Surgery | Admitting: Physical Therapy

## 2024-01-25 ENCOUNTER — Other Ambulatory Visit: Payer: Self-pay

## 2024-01-25 DIAGNOSIS — R2689 Other abnormalities of gait and mobility: Secondary | ICD-10-CM | POA: Diagnosis not present

## 2024-01-25 DIAGNOSIS — M25562 Pain in left knee: Secondary | ICD-10-CM | POA: Insufficient documentation

## 2024-01-25 DIAGNOSIS — G8929 Other chronic pain: Secondary | ICD-10-CM | POA: Insufficient documentation

## 2024-01-25 DIAGNOSIS — M6281 Muscle weakness (generalized): Secondary | ICD-10-CM | POA: Insufficient documentation

## 2024-01-25 NOTE — Telephone Encounter (Signed)
 Patient Notified and scheduled an appointment for 02/04/2024 with Dinah.

## 2024-01-25 NOTE — Patient Instructions (Signed)
 Access Code: [email or web address] URL: [email or web address] Date: 01/25/2024 Prepared by: Rosana Hoes  Exercises - Supine Piriformis Stretch with Foot on Ground  - 2 x daily - 3 reps - 30 seconds hold - Long Sitting Quad Set with Towel Roll Under Heel  - 2 x daily - 2 sets - 10 reps - 3 seconds hold - Active Straight Leg Raise with Quad Set  - 2 x daily - 2 sets - 10 reps - Modified Thomas Stretch  - 2 x daily - 3 reps - 60 seconds hold - Seated Hamstring Stretch  - 2 x daily - 3 reps - 30 seconds hold

## 2024-01-25 NOTE — Therapy (Signed)
 OUTPATIENT PHYSICAL THERAPY TREATMENT   Patient Name: Tanya Bowen MRN: [id number] DOB:12/24/1965, 58 y.o., female Today's Date: 01/25/2024   END OF SESSION:  PT End of Session - 01/25/24 1614     Visit Number 2    Number of Visits 17    Date for PT Re-Evaluation 03/15/24    Authorization Type [facility] [employer]    PT Start Time 1532    PT Stop Time 1610    PT Time Calculation (min) 38 min    Activity Tolerance Patient tolerated treatment well    Behavior During Therapy WFL for tasks assessed/performed             Past Medical History:  Diagnosis Date   Diabetes mellitus without complication (HCC)    Hyperlipidemia    Past Surgical History:  Procedure Laterality Date   childbirth  1982   1986, 1998   COLONOSCOPY  10/2020   DILATION AND CURETTAGE OF UTERUS     Patient Active Problem List   Diagnosis Date Noted   Left knee pain 11/12/2023   Mild dysplasia of cervix (CIN I) 03/05/2017    PCP: Caesar Bookman, NP  REFERRING PROVIDER: Eldred Manges, MD   REFERRING DIAG: [phone] (ICD-10-CM) - Acute pain of left knee   THERAPY DIAG:  Chronic pain of left knee  Muscle weakness (generalized)  Other abnormalities of gait and mobility  Rationale for Evaluation and Treatment: Rehabilitation  ONSET DATE: Chronic, last year   SUBJECTIVE:  SUBJECTIVE STATEMENT: Patient reports her knee has been feeling better but she is having some aching at night and her knee will still bother her when she has been sitting for extended periods.   EVAL: Patient arrives at clinic with c/c of left knee pain and limited knee ROM especially in straightening. She states that last year she went to [place] and after the flight she felt a knee pain that has not gotten better. She was put on blood thinners after visiting the MD in [place] and cleared to come back to [place]. The same pain happened on the flight back. She got injections in January which she feels like helped but she does not feel full  relief. Starting this past Friday she got the pain again. Reports that she tried to go for a walk Thursday night and it could have created her pain. She notices after resting and keeping her knee in one position her pain gets much worse. She gets a lot of numbness and tingling and cramping in the left leg.   PERTINENT HISTORY: Diabetes mellitus   PAIN:  Are you having pain?  NPRS scale: 0/10 current, 8/10 worst Pain location: Left knee Pain description: Aching Aggravating factors: Sitting, increased activity Relieving factors: Exercises, cream, rest  PRECAUTIONS: None  RED FLAGS: None   WEIGHT BEARING RESTRICTIONS: No  FALLS:  Has patient fallen in last 6 months? Yes. Number of falls tripped and hit her knee  LIVING ENVIRONMENT: Lives with: lives with their family Lives in: House/apartment Stairs: Yes  Has following equipment at home: None  OCCUPATION: Yes  PLOF: Independent  PATIENT GOALS: Return to full ROM in knee, be able to walk longer distances.    OBJECTIVE:  Note: Objective measures were completed at Evaluation unless otherwise noted. PATIENT SURVEYS:  LEFS 39/80  MUSCLE LENGTH: Hamstrings: Right - WNL, L - 50%   POSTURE: weight shift right  PALPATION: Tenderness to piriformis, hamstring, posterior and lateral knee, gastroc  LOWER EXTREMITY MMT:  MMT  Right eval Left eval  Hip flexion 4- 3  Hip extension    Hip abduction    Hip adduction    Hip internal rotation    Hip external rotation    Knee flexion 4- 3  Knee extension 4- 3  Ankle dorsiflexion 4 4-  Ankle plantarflexion 4 4-  Ankle inversion    Ankle eversion     (Blank rows = not tested)  LOWER EXTREMITY ROM:  AROM Right eval Left eval  Hip flexion    Hip extension    Hip abduction    Hip adduction    Hip internal rotation    Hip external rotation    Knee flexion 122 108  Knee extension -2 4  Ankle dorsiflexion    Ankle plantarflexion    Ankle inversion    Ankle eversion      (Blank rows = not tested)  LOWER EXTREMITY SPECIAL TESTS:  SLR: positive for neural tension  FUNCTIONAL TESTS:  30 seconds chair stand test: 8 sit to stands with hand support   GAIT: Distance walked: 40 Assistive device utilized: None Level of assistance: Complete Independence Comments: antalgic gait,weight shift right, limited hip flexion, limited knee extension                                                                                           TREATMENT DATE:  [facility] Adult PT Treatment:                                                DATE: 01/25/2024 NuStep L5 x 5 min with LE only to improve endurance and workload capacity Seated hamstring stretch 3 x 15 sec each Piriformis stretch 3 x 15 sec each Quad set 10 x 5 sec on left SLR 2 x 10 each Bridge 2 x 10 Side clamshell with red 2 x 15 each Sit to stand x 10, holding 10# at chest 2 x 10 Slant board calf stretch 3 x 30 sec Standing TKE with red x 10   PATIENT EDUCATION:  Education details: HEP update Person educated: Patient Education method: Explanation, Demonstration, Tactile cues, and Handouts Education comprehension: verbalized understanding, returned demonstration, tactile cues required, and needs further education  HOME EXERCISE PROGRAM: Access: [email or web address]   ASSESSMENT: CLINICAL IMPRESSION: Patient tolerated therapy well with no adverse effects. Therapy focused primarily on progressing her knee and hip strengthening with good tolerance. She does demonstrate improved active knee extension ability and decrease in antalgic gait this visit. Continued with some hamstring, calf, and piriformis stretching with good tolerance. She did report muscle burn with left hip strengthening and demonstrate greater difficulty with SLR exercise on the left. Updated her HEP to progress quad control. Patient would benefit from continued skilled PT to progress her strength in order to reduce pain and maximize functional ability.   EVAL:  Patient is a 58 y.o. female who was seen today for physical therapy evaluation and treatment for c/c of left knee, hip, and lower back pain. Patient  reports the pain has been occurring for a few months since she traveled to [place] and got knee pain after sitting on the plane. She got a cortisone injection in January which she reports helped her some. She has a notable weight shift to the right side in sitting and walking. Notable knee extension restrictions in gait and supine during ROM. Neural tension noted on the left side in all positions, increased with DF and straightening of leg. Hamstring stretch helps relieve neural tension. Deficits in left sided strength and ROM noted which affect gait and seated posture. Overall, patient could benefit from hip, knee, and core strengthening to increase LE strength and decrease knee and back pain. Overall, skilled Pt would assist in increasing strength and ROM to decrease pain and increase functional activity.   OBJECTIVE IMPAIRMENTS: Abnormal gait, decreased activity tolerance, difficulty walking, decreased ROM, decreased strength, and pain.   ACTIVITY LIMITATIONS: bending, sitting, standing, squatting, sleeping, and locomotion level  PARTICIPATION LIMITATIONS: community activity and occupation  PERSONAL FACTORS: Time since onset of injury/illness/exacerbation and 1 comorbidity: see PMH  are also affecting patient's functional outcome.    GOALS: Goals reviewed with patient? Yes  SHORT TERM GOALS: Target date: 02/16/2024  Patient will be I with initial HEP in order to progress with therapy. Baseline: HEP provided at eval Goal status: INITIAL  2.  Patient will score </= 6/10 at worse on the NPS to show an improvement in pain allowing for an increase in functional ability.  Baseline: 8/10 Goal status: INITIAL  LONG TERM GOALS: Target date: 03/15/2024  Patient will be independent with final HEP in order to continue treatment at home for pain tolerance.   Baseline: HEP provided at eval Goal status: INITIAL  2.  Patient will score </= 4/10 at worse on the NPS to show an improvement in pain allowing for an increase in functional ability.  Baseline: 8/10 Goal status: INITIAL  3.  Patient will score a 49/80 on the LEFS in order to show an improvement in function according to MCID guidelines. Baseline: 39/80 Goal status: INITIAL  4.  Patient will increase left LE strength to a 4/5 to show an increase in LE strength to improve function  Baseline: see above Goal status: INITIAL  5. Patient will demonstrate ability to ambulate without gait deviations to improve community access  Baseline: gait deviations noted above  Goal status: INITIAL   PLAN: PT FREQUENCY: 2x/week  PT DURATION: 8 weeks  PLANNED INTERVENTIONS: 97164- PT Re-evaluation, 97110-Therapeutic exercises, 97530- Therapeutic activity, 97112- Neuromuscular re-education, 97535- Self Care, 16109- Manual therapy, [phone]- Gait training, [phone]- Aquatic Therapy, [phone]- Electrical stimulation (unattended), [phone]- Electrical stimulation (manual), Patient/Family education, Balance training, Stair training, Taping, Dry Needling, Joint mobilization, Joint manipulation, Spinal manipulation, Spinal mobilization, Cryotherapy, and Moist heat  PLAN FOR NEXT SESSION: review/revise HEP. Knee extension ROM. Glute, hip, and quad strengthening. Piriformis, hamstring, and calf stretching will be beneficial. Potential seated nerve glides if hamstring stretch stops being beneficial.    Rosana Hoes, PT, DPT, LAT, ATC 01/25/24  4:22 PM Phone: [phone] Fax: [phone]

## 2024-01-27 ENCOUNTER — Other Ambulatory Visit: Payer: Self-pay

## 2024-01-27 ENCOUNTER — Ambulatory Visit: Payer: Commercial Managed Care - PPO | Admitting: Physical Therapy

## 2024-01-27 ENCOUNTER — Encounter: Payer: Self-pay | Admitting: Physical Therapy

## 2024-01-27 DIAGNOSIS — G8929 Other chronic pain: Secondary | ICD-10-CM | POA: Diagnosis not present

## 2024-01-27 DIAGNOSIS — M25562 Pain in left knee: Secondary | ICD-10-CM | POA: Diagnosis not present

## 2024-01-27 DIAGNOSIS — M6281 Muscle weakness (generalized): Secondary | ICD-10-CM

## 2024-01-27 DIAGNOSIS — R2689 Other abnormalities of gait and mobility: Secondary | ICD-10-CM

## 2024-01-27 NOTE — Therapy (Signed)
 OUTPATIENT PHYSICAL THERAPY TREATMENT   Patient Name: Tanya Bowen MRN: [id number] DOB:[birth year], 58 y.o., female Today's Date: 01/27/2024   END OF SESSION:  PT End of Session - 01/27/24 0750     Visit Number 3    Number of Visits 17    Date for PT Re-Evaluation 03/15/24    Authorization Type [facility] [employer]    PT Start Time 0800    PT Stop Time 0841    PT Time Calculation (min) 41 min    Activity Tolerance Patient tolerated treatment well    Behavior During Therapy [facility] for tasks assessed/performed              Past Medical History:  Diagnosis Date   Diabetes mellitus without complication (HCC)    Hyperlipidemia    Past Surgical History:  Procedure Laterality Date   childbirth  1982   1986, 1998   COLONOSCOPY  10/2020   DILATION AND CURETTAGE OF UTERUS     Patient Active Problem List   Diagnosis Date Noted   Left knee pain 11/12/2023   Mild dysplasia of cervix (CIN I) 03/05/2017    PCP: Caesar Bookman, NP  REFERRING PROVIDER: Eldred Manges, MD   REFERRING DIAG: [phone] (ICD-10-CM) - Acute pain of left knee   THERAPY DIAG:  Chronic pain of left knee  Muscle weakness (generalized)  Other abnormalities of gait and mobility  Rationale for Evaluation and Treatment: Rehabilitation  ONSET DATE: Chronic, last year   SUBJECTIVE:  SUBJECTIVE STATEMENT: Patient reports last night her knee was really swollen and the pain hit her at 3:00am so she got up and did her exercises which relieved her pain. She doesn't know if the weather or something like that caused her to have pain yesterday and sometimes it feels like her kneecap is shifted to the inside.   EVAL: Patient arrives at clinic with c/c of left knee pain and limited knee ROM especially in straightening. She states that last year she went to [place] and after the flight she felt a knee pain that has not gotten better. She was put on blood thinners after visiting the MD in [place] and cleared to come back to  [place]. The same pain happened on the flight back. She got injections in January which she feels like helped but she does not feel full relief. Starting this past Friday she got the pain again. Reports that she tried to go for a walk Thursday night and it could have created her pain. She notices after resting and keeping her knee in one position her pain gets much worse. She gets a lot of numbness and tingling and cramping in the left leg.   PERTINENT HISTORY: Diabetes mellitus   PAIN:  Are you having pain?  NPRS scale: 5/10 current, 8/10 worst Pain location: Left knee Pain description: Aching Aggravating factors: Sitting, increased activity Relieving factors: Exercises, cream, rest  PRECAUTIONS: None  PATIENT GOALS: Return to full ROM in knee, be able to walk longer distances.    OBJECTIVE:  Note: Objective measures were completed at Evaluation unless otherwise noted. PATIENT SURVEYS:  LEFS 39/80  MUSCLE LENGTH: Hamstrings: Right - WNL, L - 50%   POSTURE: weight shift right  PALPATION: Tenderness to piriformis, hamstring, posterior and lateral knee, gastroc  LOWER EXTREMITY MMT:  MMT Right eval Left eval  Hip flexion 4- 3  Hip extension    Hip abduction    Hip adduction    Hip internal rotation  Hip external rotation    Knee flexion 4- 3  Knee extension 4- 3  Ankle dorsiflexion 4 4-  Ankle plantarflexion 4 4-  Ankle inversion    Ankle eversion     (Blank rows = not tested)  LOWER EXTREMITY ROM:  AROM Right eval Left eval  Hip flexion    Hip extension    Hip abduction    Hip adduction    Hip internal rotation    Hip external rotation    Knee flexion 122 108  Knee extension -2 4  Ankle dorsiflexion    Ankle plantarflexion    Ankle inversion    Ankle eversion     (Blank rows = not tested)  LOWER EXTREMITY SPECIAL TESTS:  SLR: positive for neural tension  FUNCTIONAL TESTS:  30 seconds chair stand test: 8 sit to stands with hand support    GAIT: Distance walked: 40 Assistive device utilized: None Level of assistance: Complete Independence Comments: antalgic gait,weight shift right, limited hip flexion, limited knee extension                                                                                           TREATMENT DATE:  [facility] Adult PT Treatment:                                                DATE: 01/27/2024 NuStep L5 x 5 min with LE only to improve endurance and workload capacity Supine left patellar mobs all directions STM to left quad using roller Modified thomas stretch 3 x 30 sec on left Supine quad set with small towel roll under knee 10 x 5 sec on left SLR 2 x 10 each Bridge 2 x 10 Side clamshell with green 2 x 15 each LAQ with 5# 3 x 10 each Sit to stand holding 10# at chest 2 x 10 Seated hamstring stretch 3 x 15 sec each Standing TKE with red 2 x 10   PATIENT EDUCATION:  Education details: HEP update Person educated: Patient Education method: Explanation, Demonstration, Tactile cues, and Handouts Education comprehension: verbalized understanding, returned demonstration, tactile cues required, and needs further education  HOME EXERCISE PROGRAM: Access: [email or web address]   ASSESSMENT: CLINICAL IMPRESSION: Patient tolerated therapy well with no adverse effects. Patient arrived reporting an increase in left anterior knee pain and swelling. Performed manual for the left patellar mobility and patient reported concordant pain with inferior patellar glide so worked on quad mobility and stretching that did improve her symptoms. Continued with quad and hip strengthening exercises with good tolerance and she did report an overall improvement in pain and walking ability post therapy. Updated HEP and progress PRN. Patient would benefit from continued skilled PT to progress her strength in order to reduce pain and maximize functional ability.   EVAL: Patient is a 58 y.o. female who was seen today for physical therapy  evaluation and treatment for c/c of left knee, hip, and lower back pain. Patient reports the pain has been occurring for a  few months since she traveled to [place] and got knee pain after sitting on the plane. She got a cortisone injection in January which she reports helped her some. She has a notable weight shift to the right side in sitting and walking. Notable knee extension restrictions in gait and supine during ROM. Neural tension noted on the left side in all positions, increased with DF and straightening of leg. Hamstring stretch helps relieve neural tension. Deficits in left sided strength and ROM noted which affect gait and seated posture. Overall, patient could benefit from hip, knee, and core strengthening to increase LE strength and decrease knee and back pain. Overall, skilled Pt would assist in increasing strength and ROM to decrease pain and increase functional activity.   OBJECTIVE IMPAIRMENTS: Abnormal gait, decreased activity tolerance, difficulty walking, decreased ROM, decreased strength, and pain.   ACTIVITY LIMITATIONS: bending, sitting, standing, squatting, sleeping, and locomotion level  PARTICIPATION LIMITATIONS: community activity and occupation  PERSONAL FACTORS: Time since onset of injury/illness/exacerbation and 1 comorbidity: see PMH  are also affecting patient's functional outcome.    GOALS: Goals reviewed with patient? Yes  SHORT TERM GOALS: Target date: 02/16/2024  Patient will be I with initial HEP in order to progress with therapy. Baseline: HEP provided at eval Goal status: INITIAL  2.  Patient will score </= 6/10 at worse on the NPS to show an improvement in pain allowing for an increase in functional ability.  Baseline: 8/10 Goal status: INITIAL  LONG TERM GOALS: Target date: 03/15/2024  Patient will be independent with final HEP in order to continue treatment at home for pain tolerance.  Baseline: HEP provided at eval Goal status: INITIAL  2.   Patient will score </= 4/10 at worse on the NPS to show an improvement in pain allowing for an increase in functional ability.  Baseline: 8/10 Goal status: INITIAL  3.  Patient will score a 49/80 on the LEFS in order to show an improvement in function according to MCID guidelines. Baseline: 39/80 Goal status: INITIAL  4.  Patient will increase left LE strength to a 4/5 to show an increase in LE strength to improve function  Baseline: see above Goal status: INITIAL  5. Patient will demonstrate ability to ambulate without gait deviations to improve community access  Baseline: gait deviations noted above  Goal status: INITIAL   PLAN: PT FREQUENCY: 2x/week  PT DURATION: 8 weeks  PLANNED INTERVENTIONS: 97164- PT Re-evaluation, 97110-Therapeutic exercises, 97530- Therapeutic activity, 97112- Neuromuscular re-education, 97535- Self Care, 54098- Manual therapy, [phone]- Gait training, [phone]- Aquatic Therapy, [phone]- Electrical stimulation (unattended), [phone]- Electrical stimulation (manual), Patient/Family education, Balance training, Stair training, Taping, Dry Needling, Joint mobilization, Joint manipulation, Spinal manipulation, Spinal mobilization, Cryotherapy, and Moist heat  PLAN FOR NEXT SESSION: review/revise HEP. Knee extension ROM. Glute, hip, and quad strengthening. Piriformis, hamstring, and calf stretching will be beneficial. Potential seated nerve glides if hamstring stretch stops being beneficial.    Rosana Hoes, PT, DPT, LAT, ATC 01/27/24  9:14 AM Phone: [phone] Fax: [phone]

## 2024-01-27 NOTE — Patient Instructions (Signed)
 Access Code: [email or web address] URL: [email or web address] Date: 01/27/2024 Prepared by: Rosana Hoes  Exercises - Supine Piriformis Stretch with Foot on Ground  - 1-2 x daily - 3 reps - 30 seconds hold - Long Sitting Quad Set with Towel Roll Under Heel  - 1-2 x daily - 2 sets - 10 reps - 3 seconds hold - Active Straight Leg Raise with Quad Set  - 1-2 x daily - 2 sets - 10 reps - Clam with Resistance  - 1-2 x daily - 2 sets - 15 reps - Modified Thomas Stretch  - 1-2 x daily - 3 reps - 60 seconds hold - Seated Hamstring Stretch  - 1-2 x daily - 3 reps - 30 seconds hold

## 2024-02-03 ENCOUNTER — Ambulatory Visit: Payer: Commercial Managed Care - PPO

## 2024-02-03 DIAGNOSIS — M25562 Pain in left knee: Secondary | ICD-10-CM | POA: Diagnosis not present

## 2024-02-03 DIAGNOSIS — G8929 Other chronic pain: Secondary | ICD-10-CM | POA: Diagnosis not present

## 2024-02-03 DIAGNOSIS — M6281 Muscle weakness (generalized): Secondary | ICD-10-CM

## 2024-02-03 DIAGNOSIS — R2689 Other abnormalities of gait and mobility: Secondary | ICD-10-CM | POA: Diagnosis not present

## 2024-02-03 NOTE — Therapy (Signed)
 OUTPATIENT PHYSICAL THERAPY TREATMENT   Patient Name: Tanya Bowen MRN: [id number] DOB:July 24, 1966, 58 y.o., female Today's Date: 02/03/2024   END OF SESSION:  PT End of Session - 02/03/24 1046     Visit Number 4    Number of Visits 17    Date for PT Re-Evaluation 03/15/24    Authorization Type [facility] [employer]    PT Start Time 1046    PT Stop Time 1125    PT Time Calculation (min) 39 min    Activity Tolerance Patient tolerated treatment well    Behavior During Therapy WFL for tasks assessed/performed               Past Medical History:  Diagnosis Date   Diabetes mellitus without complication (HCC)    Hyperlipidemia    Past Surgical History:  Procedure Laterality Date   childbirth  1982   1986, 1998   COLONOSCOPY  10/2020   DILATION AND CURETTAGE OF UTERUS     Patient Active Problem List   Diagnosis Date Noted   Left knee pain 11/12/2023   Mild dysplasia of cervix (CIN I) 03/05/2017    PCP: Caesar Bookman, NP  REFERRING PROVIDER: Eldred Manges, MD   REFERRING DIAG: [phone] (ICD-10-CM) - Acute pain of left knee   THERAPY DIAG:  Chronic pain of left knee  Muscle weakness (generalized)  Other abnormalities of gait and mobility  Rationale for Evaluation and Treatment: Rehabilitation  ONSET DATE: Chronic, last year   SUBJECTIVE:  SUBJECTIVE STATEMENT:  Patient reports that she is keeping up with her exercises daily, and feels that she has been making improvements since starting PT. She still has some swelling in her left knee. After asked about onset of pain, she also recalls an instance when she was walking her dog last summer. She states "my dog pulled me and I slid on the grass, but I don't remember having any pain until my trip.    EVAL: Patient arrives at clinic with c/c of left knee pain and limited knee ROM especially in straightening. She states that last year she went to [place] and after the flight she felt a knee pain that has not gotten better.  She was put on blood thinners after visiting the MD in [place] and cleared to come back to [place]. The same pain happened on the flight back. She got injections in January which she feels like helped but she does not feel full relief. Starting this past Friday she got the pain again. Reports that she tried to go for a walk Thursday night and it could have created her pain. She notices after resting and keeping her knee in one position her pain gets much worse. She gets a lot of numbness and tingling and cramping in the left leg.   PERTINENT HISTORY: Diabetes mellitus   PAIN:  Are you having pain?  NPRS scale: 5/10 current, 8/10 worst Pain location: Left knee Pain description: Aching Aggravating factors: Sitting, increased activity Relieving factors: Exercises, cream, rest  PRECAUTIONS: None  PATIENT GOALS: Return to full ROM in knee, be able to walk longer distances.    OBJECTIVE:  Note: Objective measures were completed at Evaluation unless otherwise noted. PATIENT SURVEYS:  LEFS 39/80  MUSCLE LENGTH: Hamstrings: Right - WNL, L - 50%   POSTURE: weight shift right  PALPATION: Tenderness to piriformis, hamstring, posterior and lateral knee, gastroc  LOWER EXTREMITY MMT:  MMT Right eval Left eval  Hip flexion 4- 3  Hip  extension    Hip abduction    Hip adduction    Hip internal rotation    Hip external rotation    Knee flexion 4- 3  Knee extension 4- 3  Ankle dorsiflexion 4 4-  Ankle plantarflexion 4 4-  Ankle inversion    Ankle eversion     (Blank rows = not tested)  LOWER EXTREMITY ROM:  AROM Right eval Left eval  Hip flexion    Hip extension    Hip abduction    Hip adduction    Hip internal rotation    Hip external rotation    Knee flexion 122 108  Knee extension -2 4  Ankle dorsiflexion    Ankle plantarflexion    Ankle inversion    Ankle eversion     (Blank rows = not tested)  LOWER EXTREMITY SPECIAL TESTS:  SLR: positive for neural  tension  FUNCTIONAL TESTS:  30 seconds chair stand test: 8 sit to stands with hand support   GAIT: Distance walked: 40 Assistive device utilized: None Level of assistance: Complete Independence Comments: antalgic gait,weight shift right, limited hip flexion, limited knee extension                                                                                           TREATMENT DATE:     [facility] Adult PT Treatment:                                                DATE: 02/03/2024  Therapeutic Exercise: NuStep L5 x 5 min for AAROM,  Modified thomas stretch 4 x 10 sec on left with stretching strap  Bridge 2 x 10, 3 sec  SLR 2 x 10 each LAQ with 5# 3 x 10 each Sit to stand holding 10# at chest 2 x 10 Standing TKE with GTB 2 x 10 Seated clamshells with blue TB, 2 x 12   [facility] Adult PT Treatment:                                                DATE: 01/27/2024 NuStep L5 x 5 min with LE only to improve endurance and workload capacity Supine left patellar mobs all directions STM to left quad using roller Modified thomas stretch 3 x 30 sec on left Supine quad set with small towel roll under knee 10 x 5 sec on left SLR 2 x 10 each Bridge 2 x 10 Side clamshell with green 2 x 15 each LAQ with 5# 3 x 10 each Sit to stand holding 10# at chest 2 x 10 Seated hamstring stretch 3 x 15 sec each Standing TKE with red 2 x 10   PATIENT EDUCATION:  Education details: HEP update Person educated: Patient Education method: Explanation, Demonstration, Tactile cues, and Handouts Education comprehension: verbalized understanding, returned demonstration, tactile cues required, and needs further education  HOME EXERCISE PROGRAM: Access: [email or web address]   ASSESSMENT: CLINICAL IMPRESSION: Patient responded very well to exercises today. She reported readiness for increased resistance with LE strengthening activities, which we will continue to progress per patient tolerance. She may additionally benefit from  taping at future visit to help with edema management. We will continue to progress per established POC towards rehab goals.     EVAL: Patient is a 58 y.o. female who was seen today for physical therapy evaluation and treatment for c/c of left knee, hip, and lower back pain. Patient reports the pain has been occurring for a few months since she traveled to [place] and got knee pain after sitting on the plane. She got a cortisone injection in January which she reports helped her some. She has a notable weight shift to the right side in sitting and walking. Notable knee extension restrictions in gait and supine during ROM. Neural tension noted on the left side in all positions, increased with DF and straightening of leg. Hamstring stretch helps relieve neural tension. Deficits in left sided strength and ROM noted which affect gait and seated posture. Overall, patient could benefit from hip, knee, and core strengthening to increase LE strength and decrease knee and back pain. Overall, skilled Pt would assist in increasing strength and ROM to decrease pain and increase functional activity.   OBJECTIVE IMPAIRMENTS: Abnormal gait, decreased activity tolerance, difficulty walking, decreased ROM, decreased strength, and pain.   ACTIVITY LIMITATIONS: bending, sitting, standing, squatting, sleeping, and locomotion level  PARTICIPATION LIMITATIONS: community activity and occupation  PERSONAL FACTORS: Time since onset of injury/illness/exacerbation and 1 comorbidity: see PMH  are also affecting patient's functional outcome.    GOALS: Goals reviewed with patient? Yes  SHORT TERM GOALS: Target date: 02/16/2024  Patient will be I with initial HEP in order to progress with therapy. Baseline: HEP provided at eval Goal status: INITIAL  2.  Patient will score </= 6/10 at worse on the NPS to show an improvement in pain allowing for an increase in functional ability.  Baseline: 8/10 Goal status: INITIAL  LONG  TERM GOALS: Target date: 03/15/2024  Patient will be independent with final HEP in order to continue treatment at home for pain tolerance.  Baseline: HEP provided at eval Goal status: INITIAL  2.  Patient will score </= 4/10 at worse on the NPS to show an improvement in pain allowing for an increase in functional ability.  Baseline: 8/10 Goal status: INITIAL  3.  Patient will score a 49/80 on the LEFS in order to show an improvement in function according to MCID guidelines. Baseline: 39/80 Goal status: INITIAL  4.  Patient will increase left LE strength to a 4/5 to show an increase in LE strength to improve function  Baseline: see above Goal status: INITIAL  5. Patient will demonstrate ability to ambulate without gait deviations to improve community access  Baseline: gait deviations noted above  Goal status: INITIAL   PLAN: PT FREQUENCY: 2x/week  PT DURATION: 8 weeks  PLANNED INTERVENTIONS: 97164- PT Re-evaluation, 97110-Therapeutic exercises, 97530- Therapeutic activity, 97112- Neuromuscular re-education, 97535- Self Care, 40981- Manual therapy, [phone]- Gait training, [phone]- Aquatic Therapy, [phone]- Electrical stimulation (unattended), [phone]- Electrical stimulation (manual), Patient/Family education, Balance training, Stair training, Taping, Dry Needling, Joint mobilization, Joint manipulation, Spinal manipulation, Spinal mobilization, Cryotherapy, and Moist heat  PLAN FOR NEXT SESSION: review/revise HEP. Knee extension ROM. Glute, hip, and quad strengthening. Piriformis, hamstring, and calf stretching will be beneficial. Potential seated nerve glides if hamstring stretch  stops being beneficial.    Mauri Reading, PT, DPT  02/03/2024 12:02 PM

## 2024-02-04 ENCOUNTER — Encounter: Admitting: Family

## 2024-02-04 ENCOUNTER — Telehealth: Payer: Self-pay

## 2024-02-04 NOTE — Telephone Encounter (Signed)
 Please schedule appointment if still having any questions to discuss medication.

## 2024-02-04 NOTE — Telephone Encounter (Signed)
 Patient came in for appointment  and refuse to check in because she wanted a medication change without being seen.Patient stated she did not want to create a new bill I even offered a visit video patient declined and left the office.Also patient last visit in office was dec.31 2024.  DW

## 2024-02-05 ENCOUNTER — Ambulatory Visit: Payer: Commercial Managed Care - PPO

## 2024-02-08 ENCOUNTER — Encounter: Payer: Self-pay | Admitting: Physical Therapy

## 2024-02-08 ENCOUNTER — Ambulatory Visit: Payer: Commercial Managed Care - PPO | Admitting: Physical Therapy

## 2024-02-08 ENCOUNTER — Other Ambulatory Visit: Payer: Self-pay

## 2024-02-08 DIAGNOSIS — R2689 Other abnormalities of gait and mobility: Secondary | ICD-10-CM

## 2024-02-08 DIAGNOSIS — G8929 Other chronic pain: Secondary | ICD-10-CM

## 2024-02-08 DIAGNOSIS — M25562 Pain in left knee: Secondary | ICD-10-CM | POA: Diagnosis not present

## 2024-02-08 DIAGNOSIS — M6281 Muscle weakness (generalized): Secondary | ICD-10-CM | POA: Diagnosis not present

## 2024-02-08 NOTE — Therapy (Signed)
 OUTPATIENT PHYSICAL THERAPY TREATMENT   Patient Name: Tanya Bowen MRN: [id number] DOB:[birth year], 58 y.o., female Today's Date: 02/08/2024   END OF SESSION:  PT End of Session - 02/08/24 1323     Visit Number 5    Number of Visits 17    Date for PT Re-Evaluation 03/15/24    Authorization Type [facility] [employer]    PT Start Time 1318    PT Stop Time 1400    PT Time Calculation (min) 42 min    Activity Tolerance Patient tolerated treatment well    Behavior During Therapy WFL for tasks assessed/performed                Past Medical History:  Diagnosis Date   Diabetes mellitus without complication (HCC)    Hyperlipidemia    Past Surgical History:  Procedure Laterality Date   childbirth  1982   1986, 1998   COLONOSCOPY  10/2020   DILATION AND CURETTAGE OF UTERUS     Patient Active Problem List   Diagnosis Date Noted   Left knee pain 11/12/2023   Mild dysplasia of cervix (CIN I) 03/05/2017    PCP: Caesar Bookman, NP  REFERRING PROVIDER: Eldred Manges, MD   REFERRING DIAG: [phone] (ICD-10-CM) - Acute pain of left knee   THERAPY DIAG:  Chronic pain of left knee  Muscle weakness (generalized)  Other abnormalities of gait and mobility  Rationale for Evaluation and Treatment: Rehabilitation  ONSET DATE: Chronic, last year   SUBJECTIVE:  SUBJECTIVE STATEMENT: Patient reports the pain has gone down 75-80%. She has been doing the exercises, and when she does get pain she does the exercises which help.    EVAL: Patient arrives at clinic with c/c of left knee pain and limited knee ROM especially in straightening. She states that last year she went to [place] and after the flight she felt a knee pain that has not gotten better. She was put on blood thinners after visiting the MD in [place] and cleared to come back to [place]. The same pain happened on the flight back. She got injections in January which she feels like helped but she does not feel full relief. Starting  this past Friday she got the pain again. Reports that she tried to go for a walk Thursday night and it could have created her pain. She notices after resting and keeping her knee in one position her pain gets much worse. She gets a lot of numbness and tingling and cramping in the left leg.   PERTINENT HISTORY: See PMH above  PAIN:  Are you having pain?  NPRS scale: 0/10 current, 8/10 worst Pain location: Left knee Pain description: Aching Aggravating factors: Sitting, increased activity Relieving factors: Exercises, cream, rest  PRECAUTIONS: None  PATIENT GOALS: Return to full ROM in knee, be able to walk longer distances.    OBJECTIVE:  Note: Objective measures were completed at Evaluation unless otherwise noted. PATIENT SURVEYS:  LEFS 39/80  MUSCLE LENGTH: Hamstrings: Right - WNL, L - 50%   POSTURE: weight shift right  PALPATION: Tenderness to piriformis, hamstring, posterior and lateral knee, gastroc  LOWER EXTREMITY MMT:  MMT Right eval Left eval  Hip flexion 4- 3  Hip extension    Hip abduction    Hip adduction    Hip internal rotation    Hip external rotation    Knee flexion 4- 3  Knee extension 4- 3  Ankle dorsiflexion 4 4-  Ankle plantarflexion 4 4-  Ankle inversion    Ankle eversion     (Blank rows = not tested)  LOWER EXTREMITY ROM:  AROM Right eval Left eval  Hip flexion    Hip extension    Hip abduction    Hip adduction    Hip internal rotation    Hip external rotation    Knee flexion 122 108  Knee extension -2 4  Ankle dorsiflexion    Ankle plantarflexion    Ankle inversion    Ankle eversion     (Blank rows = not tested)  LOWER EXTREMITY SPECIAL TESTS:  SLR: positive for neural tension  FUNCTIONAL TESTS:  30 seconds chair stand test: 8 sit to stands with hand support   GAIT: Distance walked: 40 Assistive device utilized: None Level of assistance: Complete Independence Comments: antalgic gait,weight shift right, limited hip  flexion, limited knee extension                                                                                           TREATMENT DATE:  [facility] Adult PT Treatment:                                                DATE: 02/08/2024 NuStep L7 x 5 min with LE only to improve endurance and workload capacity Cybex leg press 60# x 10, 100# 3 x 10 Supine left patellar mobs all directions Supine quad set with small towel roll under knee 10 x 5 sec on left SLR 2 x 10 on right, 2nd set with 1.5# ankle weight Sidelying hip abduction 2 x 10 with 1.5# ankle weight Side clamshell with green 2 x 10 BATCA knee extension machine 25# 3 x 10 BATCA knee flexion machine 35# 3 x 10 Seated hamstring stretch 3 x 20 sec Bridge 2 x 10   PATIENT EDUCATION:  Education details: HEP Person educated: Patient Education method: Programmer, multimedia, Facilities manager, Actor cues Education comprehension: verbalized understanding, returned demonstration, tactile cues required, and needs further education  HOME EXERCISE PROGRAM: Access: [email or web address]   ASSESSMENT: CLINICAL IMPRESSION: Patient tolerated therapy well with no adverse effects. Therapy focused on progressing LE strengthening with good tolerance. Incorporated more machine based strengthening exercises. Continued with patellar mobs as she reports these are beneficial for her knee. She did experience some hamstring cramping following hamstring curls but this resolved with stretching. No changes made to HEP this visit. Patient would benefit from continued skilled PT to progress mobility and strength in order to reduce pain and maximize functional ability.   EVAL: Patient is a 58 y.o. female who was seen today for physical therapy evaluation and treatment for c/c of left knee, hip, and lower back pain. Patient reports the pain has been occurring for a few months since she traveled to [place] and got knee pain after sitting on the plane. She got a cortisone injection in January which  she reports helped her some. She has a notable weight shift to the right side in sitting and walking. Notable knee extension  restrictions in gait and supine during ROM. Neural tension noted on the left side in all positions, increased with DF and straightening of leg. Hamstring stretch helps relieve neural tension. Deficits in left sided strength and ROM noted which affect gait and seated posture. Overall, patient could benefit from hip, knee, and core strengthening to increase LE strength and decrease knee and back pain. Overall, skilled Pt would assist in increasing strength and ROM to decrease pain and increase functional activity.   OBJECTIVE IMPAIRMENTS: Abnormal gait, decreased activity tolerance, difficulty walking, decreased ROM, decreased strength, and pain.   ACTIVITY LIMITATIONS: bending, sitting, standing, squatting, sleeping, and locomotion level  PARTICIPATION LIMITATIONS: community activity and occupation  PERSONAL FACTORS: Time since onset of injury/illness/exacerbation and 1 comorbidity: see PMH  are also affecting patient's functional outcome.    GOALS: Goals reviewed with patient? Yes  SHORT TERM GOALS: Target date: 02/16/2024  Patient will be I with initial HEP in order to progress with therapy. Baseline: HEP provided at eval Goal status: INITIAL  2.  Patient will score </= 6/10 at worse on the NPS to show an improvement in pain allowing for an increase in functional ability.  Baseline: 8/10 Goal status: INITIAL  LONG TERM GOALS: Target date: 03/15/2024  Patient will be independent with final HEP in order to continue treatment at home for pain tolerance.  Baseline: HEP provided at eval Goal status: INITIAL  2.  Patient will score </= 4/10 at worse on the NPS to show an improvement in pain allowing for an increase in functional ability.  Baseline: 8/10 Goal status: INITIAL  3.  Patient will score a 49/80 on the LEFS in order to show an improvement in function  according to MCID guidelines. Baseline: 39/80 Goal status: INITIAL  4.  Patient will increase left LE strength to a 4/5 to show an increase in LE strength to improve function  Baseline: see above Goal status: INITIAL  5. Patient will demonstrate ability to ambulate without gait deviations to improve community access  Baseline: gait deviations noted above  Goal status: INITIAL   PLAN: PT FREQUENCY: 2x/week  PT DURATION: 8 weeks  PLANNED INTERVENTIONS: 97164- PT Re-evaluation, 97110-Therapeutic exercises, 97530- Therapeutic activity, 97112- Neuromuscular re-education, 97535- Self Care, 56213- Manual therapy, [phone]- Gait training, [phone]- Aquatic Therapy, [phone]- Electrical stimulation (unattended), [phone]- Electrical stimulation (manual), Patient/Family education, Balance training, Stair training, Taping, Dry Needling, Joint mobilization, Joint manipulation, Spinal manipulation, Spinal mobilization, Cryotherapy, and Moist heat  PLAN FOR NEXT SESSION: review/revise HEP. Knee extension ROM. Glute, hip, and quad strengthening. Piriformis, hamstring, and calf stretching will be beneficial. Potential seated nerve glides if hamstring stretch stops being beneficial.    Rosana Hoes, PT, DPT, LAT, ATC 02/08/24  2:03 PM Phone: [phone] Fax: [phone]

## 2024-02-10 ENCOUNTER — Other Ambulatory Visit: Payer: Self-pay

## 2024-02-10 ENCOUNTER — Encounter: Payer: Self-pay | Admitting: Physical Therapy

## 2024-02-10 ENCOUNTER — Ambulatory Visit: Payer: Commercial Managed Care - PPO | Admitting: Physical Therapy

## 2024-02-10 DIAGNOSIS — M25562 Pain in left knee: Secondary | ICD-10-CM | POA: Diagnosis not present

## 2024-02-10 DIAGNOSIS — G8929 Other chronic pain: Secondary | ICD-10-CM | POA: Diagnosis not present

## 2024-02-10 DIAGNOSIS — M6281 Muscle weakness (generalized): Secondary | ICD-10-CM

## 2024-02-10 DIAGNOSIS — R2689 Other abnormalities of gait and mobility: Secondary | ICD-10-CM

## 2024-02-10 NOTE — Therapy (Signed)
 OUTPATIENT PHYSICAL THERAPY TREATMENT   Patient Name: Tanya Bowen MRN: [id number] DOB:[birth year], 58 y.o., female Today's Date: 02/10/2024   END OF SESSION:  PT End of Session - 02/10/24 1620     Visit Number 6    Number of Visits 17    Date for PT Re-Evaluation 03/15/24    Authorization Type [facility] [employer]    PT Start Time 1617    PT Stop Time 1700    PT Time Calculation (min) 43 min    Activity Tolerance Patient tolerated treatment well    Behavior During Therapy [facility] for tasks assessed/performed                 Past Medical History:  Diagnosis Date   Diabetes mellitus without complication (HCC)    Hyperlipidemia    Past Surgical History:  Procedure Laterality Date   childbirth  1982   1986, 1998   COLONOSCOPY  10/2020   DILATION AND CURETTAGE OF UTERUS     Patient Active Problem List   Diagnosis Date Noted   Left knee pain 11/12/2023   Mild dysplasia of cervix (CIN I) 03/05/2017    PCP: Caesar Bookman, NP  REFERRING PROVIDER: Eldred Manges, MD   REFERRING DIAG: [phone] (ICD-10-CM) - Acute pain of left knee   THERAPY DIAG:  Chronic pain of left knee  Muscle weakness (generalized)  Other abnormalities of gait and mobility  Rationale for Evaluation and Treatment: Rehabilitation  ONSET DATE: Chronic, last year   SUBJECTIVE:  SUBJECTIVE STATEMENT: Patient reports she was having a lot of pain in the left knee last night. She states that she was good until she lied down to go to bed and then the pain started. She took some pain medication. Today she has been fine.    EVAL: Patient arrives at clinic with c/c of left knee pain and limited knee ROM especially in straightening. She states that last year she went to [place] and after the flight she felt a knee pain that has not gotten better. She was put on blood thinners after visiting the MD in [place] and cleared to come back to [place]. The same pain happened on the flight back. She got injections in  January which she feels like helped but she does not feel full relief. Starting this past Friday she got the pain again. Reports that she tried to go for a walk Thursday night and it could have created her pain. She notices after resting and keeping her knee in one position her pain gets much worse. She gets a lot of numbness and tingling and cramping in the left leg.   PERTINENT HISTORY: See PMH above  PAIN:  Are you having pain?  NPRS scale: 0/10 current, 8/10 worst Pain location: Left knee Pain description: Aching Aggravating factors: Sitting, increased activity Relieving factors: Exercises, cream, rest  PRECAUTIONS: None  PATIENT GOALS: Return to full ROM in knee, be able to walk longer distances.    OBJECTIVE:  Note: Objective measures were completed at Evaluation unless otherwise noted. PATIENT SURVEYS:  LEFS 39/80  MUSCLE LENGTH: Hamstrings: Right - WNL, L - 50%   POSTURE: weight shift right  PALPATION: Tenderness to piriformis, hamstring, posterior and lateral knee, gastroc  LOWER EXTREMITY MMT:  MMT Right eval Left eval  Hip flexion 4- 3  Hip extension    Hip abduction    Hip adduction    Hip internal rotation    Hip external rotation    Knee  flexion 4- 3  Knee extension 4- 3  Ankle dorsiflexion 4 4-  Ankle plantarflexion 4 4-  Ankle inversion    Ankle eversion     (Blank rows = not tested)  LOWER EXTREMITY ROM:  AROM Right eval Left eval  Hip flexion    Hip extension    Hip abduction    Hip adduction    Hip internal rotation    Hip external rotation    Knee flexion 122 108  Knee extension -2 4  Ankle dorsiflexion    Ankle plantarflexion    Ankle inversion    Ankle eversion     (Blank rows = not tested)  LOWER EXTREMITY SPECIAL TESTS:  SLR: positive for neural tension  FUNCTIONAL TESTS:  30 seconds chair stand test: 8 sit to stands with hand support   GAIT: Distance walked: 40 Assistive device utilized: None Level of assistance:  Complete Independence Comments: antalgic gait,weight shift right, limited hip flexion, limited knee extension                                                                                           TREATMENT DATE:  [facility] Adult PT Treatment:                                                DATE: 02/11/2024 Recumbent bike L3 x 5 min to improve endurance Cybex leg press 100# 3 x 10 Standing TKE with skinny power band 2 x 15 Supine left patellar mobs all directions Prone quad stretch 3 x 30 sec Applied Kt tape to left knee for patellar tracking and control SLR 3 x 10 Sidelying hip abduction 2 x 15 Piriformis stretch 3 x 20 sec Bridge 3 x 10 BATCA knee extension machine 25# 3 x 10   PATIENT EDUCATION:  Education details: HEP Person educated: Patient Education method: Programmer, multimedia, Facilities manager, Actor cues Education comprehension: verbalized understanding, returned demonstration, tactile cues required, and needs further education  HOME EXERCISE PROGRAM: Access: [email or web address]   ASSESSMENT: CLINICAL IMPRESSION: Patient tolerated therapy well with no adverse effects. Therapy continued focus primarily on progressing left knee and hip strengthening. She does continue to report concordant pain primarily with inferior patellar glide so trial of Kt tape for the left knee for patellar tracking and control. She was able to progress with her strengthening and does not report any increase in pain with her exercises. No changes made to her HEP this visit. Patient would benefit from continued skilled PT to progress mobility and strength in order to reduce pain and maximize functional ability.   EVAL: Patient is a 58 y.o. female who was seen today for physical therapy evaluation and treatment for c/c of left knee, hip, and lower back pain. Patient reports the pain has been occurring for a few months since she traveled to [place] and got knee pain after sitting on the plane. She got a cortisone injection in  January which she reports helped her some. She has a notable weight shift to  the right side in sitting and walking. Notable knee extension restrictions in gait and supine during ROM. Neural tension noted on the left side in all positions, increased with DF and straightening of leg. Hamstring stretch helps relieve neural tension. Deficits in left sided strength and ROM noted which affect gait and seated posture. Overall, patient could benefit from hip, knee, and core strengthening to increase LE strength and decrease knee and back pain. Overall, skilled Pt would assist in increasing strength and ROM to decrease pain and increase functional activity.   OBJECTIVE IMPAIRMENTS: Abnormal gait, decreased activity tolerance, difficulty walking, decreased ROM, decreased strength, and pain.   ACTIVITY LIMITATIONS: bending, sitting, standing, squatting, sleeping, and locomotion level  PARTICIPATION LIMITATIONS: community activity and occupation  PERSONAL FACTORS: Time since onset of injury/illness/exacerbation and 1 comorbidity: see PMH  are also affecting patient's functional outcome.    GOALS: Goals reviewed with patient? Yes  SHORT TERM GOALS: Target date: 02/16/2024  Patient will be I with initial HEP in order to progress with therapy. Baseline: HEP provided at eval Goal status: INITIAL  2.  Patient will score </= 6/10 at worse on the NPS to show an improvement in pain allowing for an increase in functional ability.  Baseline: 8/10 Goal status: INITIAL  LONG TERM GOALS: Target date: 03/15/2024  Patient will be independent with final HEP in order to continue treatment at home for pain tolerance.  Baseline: HEP provided at eval Goal status: INITIAL  2.  Patient will score </= 4/10 at worse on the NPS to show an improvement in pain allowing for an increase in functional ability.  Baseline: 8/10 Goal status: INITIAL  3.  Patient will score a 49/80 on the LEFS in order to show an improvement in  function according to MCID guidelines. Baseline: 39/80 Goal status: INITIAL  4.  Patient will increase left LE strength to a 4/5 to show an increase in LE strength to improve function  Baseline: see above Goal status: INITIAL  5. Patient will demonstrate ability to ambulate without gait deviations to improve community access  Baseline: gait deviations noted above  Goal status: INITIAL   PLAN: PT FREQUENCY: 2x/week  PT DURATION: 8 weeks  PLANNED INTERVENTIONS: 97164- PT Re-evaluation, 97110-Therapeutic exercises, 97530- Therapeutic activity, 97112- Neuromuscular re-education, 97535- Self Care, 16109- Manual therapy, [phone]- Gait training, [phone]- Aquatic Therapy, [phone]- Electrical stimulation (unattended), [phone]- Electrical stimulation (manual), Patient/Family education, Balance training, Stair training, Taping, Dry Needling, Joint mobilization, Joint manipulation, Spinal manipulation, Spinal mobilization, Cryotherapy, and Moist heat  PLAN FOR NEXT SESSION: review/revise HEP. Knee extension ROM. Glute, hip, and quad strengthening. Piriformis, hamstring, and calf stretching will be beneficial. Potential seated nerve glides if hamstring stretch stops being beneficial.    Rosana Hoes, PT, DPT, LAT, ATC 02/10/24  5:04 PM Phone: [phone] Fax: [phone]

## 2024-02-13 ENCOUNTER — Encounter: Payer: Self-pay | Admitting: Family

## 2024-02-13 NOTE — Progress Notes (Signed)
  This encounter was created in error - please disregard. Error...Marland KitchenMarland Kitchen

## 2024-02-15 ENCOUNTER — Encounter: Payer: Commercial Managed Care - PPO | Admitting: Physical Therapy

## 2024-02-16 ENCOUNTER — Ambulatory Visit: Admitting: Physical Therapy

## 2024-02-16 ENCOUNTER — Other Ambulatory Visit: Payer: Self-pay

## 2024-02-16 ENCOUNTER — Encounter: Payer: Self-pay | Admitting: Physical Therapy

## 2024-02-16 DIAGNOSIS — M25562 Pain in left knee: Secondary | ICD-10-CM | POA: Diagnosis not present

## 2024-02-16 DIAGNOSIS — R2689 Other abnormalities of gait and mobility: Secondary | ICD-10-CM | POA: Diagnosis not present

## 2024-02-16 DIAGNOSIS — M6281 Muscle weakness (generalized): Secondary | ICD-10-CM

## 2024-02-16 DIAGNOSIS — G8929 Other chronic pain: Secondary | ICD-10-CM | POA: Diagnosis not present

## 2024-02-16 NOTE — Therapy (Signed)
 OUTPATIENT PHYSICAL THERAPY TREATMENT   Patient Name: Tanya Bowen MRN: [id number] DOB:10-13-1966, 58 y.o., female Today's Date: 02/16/2024   END OF SESSION:  PT End of Session - 02/16/24 1139     Visit Number 7    Number of Visits 17    Date for PT Re-Evaluation 03/15/24    Authorization Type [facility] [employer]    PT Start Time 1145    PT Stop Time 1226    PT Time Calculation (min) 41 min    Activity Tolerance Patient tolerated treatment well    Behavior During Therapy WFL for tasks assessed/performed                  Past Medical History:  Diagnosis Date   Diabetes mellitus without complication (HCC)    Hyperlipidemia    Past Surgical History:  Procedure Laterality Date   childbirth  1982   1986, 1998   COLONOSCOPY  10/2020   DILATION AND CURETTAGE OF UTERUS     Patient Active Problem List   Diagnosis Date Noted   Left knee pain 11/12/2023   Mild dysplasia of cervix (CIN I) 03/05/2017    PCP: Caesar Bookman, NP  REFERRING PROVIDER: Eldred Manges, MD   REFERRING DIAG: [phone] (ICD-10-CM) - Acute pain of left knee   THERAPY DIAG:  Chronic pain of left knee  Muscle weakness (generalized)  Other abnormalities of gait and mobility  Rationale for Evaluation and Treatment: Rehabilitation  ONSET DATE: Chronic, last year   SUBJECTIVE:  SUBJECTIVE STATEMENT: Patient reports she went to the gym yesterday and cycled for 45 minutes. She states the tape helped a lot and she hasn't been woken up by the pain recently.    EVAL: Patient arrives at clinic with c/c of left knee pain and limited knee ROM especially in straightening. She states that last year she went to [place] and after the flight she felt a knee pain that has not gotten better. She was put on blood thinners after visiting the MD in [place] and cleared to come back to [place]. The same pain happened on the flight back. She got injections in January which she feels like helped but she does not feel full  relief. Starting this past Friday she got the pain again. Reports that she tried to go for a walk Thursday night and it could have created her pain. She notices after resting and keeping her knee in one position her pain gets much worse. She gets a lot of numbness and tingling and cramping in the left leg.   PERTINENT HISTORY: See PMH above  PAIN:  Are you having pain?  NPRS scale: 0/10 current, 8/10 worst Pain location: Left knee Pain description: Aching Aggravating factors: Sitting, increased activity Relieving factors: Exercises, cream, rest  PRECAUTIONS: None  PATIENT GOALS: Return to full ROM in knee, be able to walk longer distances.    OBJECTIVE:  Note: Objective measures were completed at Evaluation unless otherwise noted. PATIENT SURVEYS:  LEFS 39/80  MUSCLE LENGTH: Hamstrings: Right - WNL, L - 50%   POSTURE: weight shift right  PALPATION: Tenderness to piriformis, hamstring, posterior and lateral knee, gastroc  LOWER EXTREMITY MMT:  MMT Right eval Left eval Rt / Lt 02/16/2024  Hip flexion 4- 3   Hip extension     Hip abduction     Hip adduction     Hip internal rotation     Hip external rotation     Knee flexion 4- 3  4 / 4  Knee extension 4- 3 4 / 4  Ankle dorsiflexion 4 4-   Ankle plantarflexion 4 4-   Ankle inversion     Ankle eversion      (Blank rows = not tested)  LOWER EXTREMITY ROM:  AROM Right eval Left eval  Hip flexion    Hip extension    Hip abduction    Hip adduction    Hip internal rotation    Hip external rotation    Knee flexion 122 108  Knee extension -2 4  Ankle dorsiflexion    Ankle plantarflexion    Ankle inversion    Ankle eversion     (Blank rows = not tested)  LOWER EXTREMITY SPECIAL TESTS:  SLR: positive for neural tension  FUNCTIONAL TESTS:  30 seconds chair stand test: 8 sit to stands with hand support   GAIT: Distance walked: 40 Assistive device utilized: None Level of assistance: Complete  Independence Comments: antalgic gait,weight shift right, limited hip flexion, limited knee extension                                                                                           TREATMENT DATE:  [facility] Adult PT Treatment:                                                DATE: 02/16/2024 Recumbent bike L3 x 5 min to improve endurance Applied KT tape to left knee for patellar tracking and control Cybex leg press 100# x 14, 120# 2 x 10 Lateral band walk with green at knees x 2 laps, green at ankles x 1 lap LAQ with 5# 3 x 10 each Bent over hip extension with green at knees 3 x 10 each Standing heel raises 2 x 20 Forward 8" step-up x 10 each, holding 10# bilaterally 2 x 10 each   PATIENT EDUCATION:  Education details: HEP Person educated: Patient Education method: Programmer, multimedia, Demonstration, Actor cues Education comprehension: verbalized understanding, returned demonstration, tactile cues required, and needs further education  HOME EXERCISE PROGRAM: Access: [email or web address]   ASSESSMENT: CLINICAL IMPRESSION: Patient tolerated therapy well with no adverse effects. Continued with KT tape for the left to improve proprioception of patellar tracking. Therapy continues to focus on progressing knee and hip strengthening with good tolerance. She does demonstrate improvement in her knee extension strength this visit and did not report any pain with therapy. No changes made to HEP this visit but patient was encouraged to incorporate some LE strengthening machines in her gym routine to progress leg strength. Patient would benefit from continued skilled PT to progress mobility and strength in order to reduce pain and maximize functional ability.   EVAL: Patient is a 58 y.o. female who was seen today for physical therapy evaluation and treatment for c/c of left knee, hip, and lower back pain. Patient reports the pain has been occurring for a few months since she traveled to [place] and got knee pain  after sitting on the plane.  She got a cortisone injection in January which she reports helped her some. She has a notable weight shift to the right side in sitting and walking. Notable knee extension restrictions in gait and supine during ROM. Neural tension noted on the left side in all positions, increased with DF and straightening of leg. Hamstring stretch helps relieve neural tension. Deficits in left sided strength and ROM noted which affect gait and seated posture. Overall, patient could benefit from hip, knee, and core strengthening to increase LE strength and decrease knee and back pain. Overall, skilled Pt would assist in increasing strength and ROM to decrease pain and increase functional activity.   OBJECTIVE IMPAIRMENTS: Abnormal gait, decreased activity tolerance, difficulty walking, decreased ROM, decreased strength, and pain.   ACTIVITY LIMITATIONS: bending, sitting, standing, squatting, sleeping, and locomotion level  PARTICIPATION LIMITATIONS: community activity and occupation  PERSONAL FACTORS: Time since onset of injury/illness/exacerbation and 1 comorbidity: see PMH  are also affecting patient's functional outcome.    GOALS: Goals reviewed with patient? Yes  SHORT TERM GOALS: Target date: 02/16/2024  Patient will be I with initial HEP in order to progress with therapy. Baseline: HEP provided at eval 02/16/2024: independent with initial HEP Goal status: MET  2.  Patient will score </= 6/10 at worse on the NPS to show an improvement in pain allowing for an increase in functional ability.  Baseline: 8/10 02/16/2024: patient does report occasional severe pain that can wake her Goal status: PARTIALLY MET  LONG TERM GOALS: Target date: 03/15/2024  Patient will be independent with final HEP in order to continue treatment at home for pain tolerance.  Baseline: HEP provided at eval Goal status: INITIAL  2.  Patient will score </= 4/10 at worse on the NPS to show an improvement  in pain allowing for an increase in functional ability.  Baseline: 8/10 Goal status: INITIAL  3.  Patient will score a 49/80 on the LEFS in order to show an improvement in function according to MCID guidelines. Baseline: 39/80 Goal status: INITIAL  4.  Patient will increase left LE strength to a 4/5 to show an increase in LE strength to improve function  Baseline: see above Goal status: INITIAL  5. Patient will demonstrate ability to ambulate without gait deviations to improve community access  Baseline: gait deviations noted above  Goal status: INITIAL   PLAN: PT FREQUENCY: 2x/week  PT DURATION: 8 weeks  PLANNED INTERVENTIONS: 97164- PT Re-evaluation, 97110-Therapeutic exercises, 97530- Therapeutic activity, 97112- Neuromuscular re-education, 97535- Self Care, 78469- Manual therapy, [phone]- Gait training, [phone]- Aquatic Therapy, [phone]- Electrical stimulation (unattended), [phone]- Electrical stimulation (manual), Patient/Family education, Balance training, Stair training, Taping, Dry Needling, Joint mobilization, Joint manipulation, Spinal manipulation, Spinal mobilization, Cryotherapy, and Moist heat  PLAN FOR NEXT SESSION: Review HEP and progress PRN, continue taping for left knee if beneficial, progress hip, knee, core strengthening    Rosana Hoes, PT, DPT, LAT, ATC 02/16/24  12:32 PM Phone: [phone] Fax: [phone]

## 2024-02-17 ENCOUNTER — Encounter: Payer: Commercial Managed Care - PPO | Admitting: Physical Therapy

## 2024-02-18 ENCOUNTER — Encounter: Payer: Self-pay | Admitting: Physical Therapy

## 2024-02-18 ENCOUNTER — Ambulatory Visit: Admitting: Physical Therapy

## 2024-02-18 ENCOUNTER — Other Ambulatory Visit: Payer: Self-pay

## 2024-02-18 DIAGNOSIS — R2689 Other abnormalities of gait and mobility: Secondary | ICD-10-CM

## 2024-02-18 DIAGNOSIS — G8929 Other chronic pain: Secondary | ICD-10-CM

## 2024-02-18 DIAGNOSIS — M6281 Muscle weakness (generalized): Secondary | ICD-10-CM | POA: Diagnosis not present

## 2024-02-18 DIAGNOSIS — M25562 Pain in left knee: Secondary | ICD-10-CM | POA: Diagnosis not present

## 2024-02-18 NOTE — Therapy (Signed)
 OUTPATIENT PHYSICAL THERAPY TREATMENT   Patient Name: Tanya Bowen MRN: [id number] DOB:05-08-1966, 58 y.o., female Today's Date: 02/18/2024   END OF SESSION:  PT End of Session - 02/18/24 0858     Visit Number 8    Number of Visits 17    Date for PT Re-Evaluation 03/15/24    Authorization Type [facility] [employer]    PT Start Time [phone]    PT Stop Time 0930    PT Time Calculation (min) 42 min    Activity Tolerance Patient tolerated treatment well    Behavior During Therapy [facility] for tasks assessed/performed                   Past Medical History:  Diagnosis Date   Diabetes mellitus without complication (HCC)    Hyperlipidemia    Past Surgical History:  Procedure Laterality Date   childbirth  1982   1986, 1998   COLONOSCOPY  10/2020   DILATION AND CURETTAGE OF UTERUS     Patient Active Problem List   Diagnosis Date Noted   Left knee pain 11/12/2023   Mild dysplasia of cervix (CIN I) 03/05/2017    PCP: Caesar Bookman, NP  REFERRING PROVIDER: Eldred Manges, MD   REFERRING DIAG: [phone] (ICD-10-CM) - Acute pain of left knee   THERAPY DIAG:  Chronic pain of left knee  Muscle weakness (generalized)  Other abnormalities of gait and mobility  Rationale for Evaluation and Treatment: Rehabilitation  ONSET DATE: Chronic, last year   SUBJECTIVE:  SUBJECTIVE STATEMENT: Patient reports she is having a little swelling in her knee this morning   EVAL: Patient arrives at clinic with c/c of left knee pain and limited knee ROM especially in straightening. She states that last year she went to [place] and after the flight she felt a knee pain that has not gotten better. She was put on blood thinners after visiting the MD in [place] and cleared to come back to [place]. The same pain happened on the flight back. She got injections in January which she feels like helped but she does not feel full relief. Starting this past Friday she got the pain again. Reports that she tried to  go for a walk Thursday night and it could have created her pain. She notices after resting and keeping her knee in one position her pain gets much worse. She gets a lot of numbness and tingling and cramping in the left leg.   PERTINENT HISTORY: See PMH above  PAIN:  Are you having pain?  NPRS scale: 0/10 current, 8/10 worst Pain location: Left knee Pain description: Aching Aggravating factors: Sitting, increased activity Relieving factors: Exercises, cream, rest  PRECAUTIONS: None  PATIENT GOALS: Return to full ROM in knee, be able to walk longer distances.    OBJECTIVE:  Note: Objective measures were completed at Evaluation unless otherwise noted. PATIENT SURVEYS:  LEFS 39/80  MUSCLE LENGTH: Hamstrings: Right - WNL, L - 50%   POSTURE: weight shift right  PALPATION: Tenderness to piriformis, hamstring, posterior and lateral knee, gastroc  LOWER EXTREMITY MMT:  MMT Right eval Left eval Rt / Lt 02/16/2024  Hip flexion 4- 3   Hip extension     Hip abduction     Hip adduction     Hip internal rotation     Hip external rotation     Knee flexion 4- 3 4 / 4  Knee extension 4- 3 4 / 4  Ankle dorsiflexion 4 4-  Ankle plantarflexion 4 4-   Ankle inversion     Ankle eversion      (Blank rows = not tested)  LOWER EXTREMITY ROM:  AROM Right eval Left eval  Hip flexion    Hip extension    Hip abduction    Hip adduction    Hip internal rotation    Hip external rotation    Knee flexion 122 108  Knee extension -2 4  Ankle dorsiflexion    Ankle plantarflexion    Ankle inversion    Ankle eversion     (Blank rows = not tested)  LOWER EXTREMITY SPECIAL TESTS:  SLR: positive for neural tension  FUNCTIONAL TESTS:  30 seconds chair stand test: 8 sit to stands with hand support   GAIT: Distance walked: 40 Assistive device utilized: None Level of assistance: Complete Independence Comments: antalgic gait,weight shift right, limited hip flexion, limited knee  extension                                                                                           TREATMENT DATE:  [facility] Adult PT Treatment:                                                DATE: 02/18/2024 Recumbent bike L3 x 5 min to improve endurance Left patellar mobs all directions Modified thomas stretch with passive knee flexion 3 x 30 sec Cybex leg press DL 914# 3 x 10, SL 78# 2 x 10 Goblet squat to mat table with 15# 2 x 10 Lateral band walk with green at ankles x 3 lap SLS on Airex 3 x 30 sec   PATIENT EDUCATION:  Education details: HEP Person educated: Patient Education method: Programmer, multimedia, Demonstration, Actor cues Education comprehension: verbalized understanding, returned demonstration, tactile cues required, and needs further education  HOME EXERCISE PROGRAM: Access: [email or web address]   ASSESSMENT: CLINICAL IMPRESSION: Patient tolerated therapy well with no adverse effects. She did report some swelling of the left knee this morning but denies any pain currently. Performed patellar mobs and quad stretching with good therapeutic benefit. Therapy continues to focus on progressing knee and hip strengthening with good tolerance. Incorporated some SL balance on unstable surface with only occasional need for UE for support.Overall she seems to be progressing well and reporting less left knee pain. No changes to HEP this visit. Patient would benefit from continued skilled PT to progress mobility and strength in order to reduce pain and maximize functional ability.   EVAL: Patient is a 58 y.o. female who was seen today for physical therapy evaluation and treatment for c/c of left knee, hip, and lower back pain. Patient reports the pain has been occurring for a few months since she traveled to [place] and got knee pain after sitting on the plane. She got a cortisone injection in January which she reports helped her some. She has a notable weight shift to the right side in sitting and walking.  Notable knee extension restrictions in gait and supine  during ROM. Neural tension noted on the left side in all positions, increased with DF and straightening of leg. Hamstring stretch helps relieve neural tension. Deficits in left sided strength and ROM noted which affect gait and seated posture. Overall, patient could benefit from hip, knee, and core strengthening to increase LE strength and decrease knee and back pain. Overall, skilled Pt would assist in increasing strength and ROM to decrease pain and increase functional activity.   OBJECTIVE IMPAIRMENTS: Abnormal gait, decreased activity tolerance, difficulty walking, decreased ROM, decreased strength, and pain.   ACTIVITY LIMITATIONS: bending, sitting, standing, squatting, sleeping, and locomotion level  PARTICIPATION LIMITATIONS: community activity and occupation  PERSONAL FACTORS: Time since onset of injury/illness/exacerbation and 1 comorbidity: see PMH  are also affecting patient's functional outcome.    GOALS: Goals reviewed with patient? Yes  SHORT TERM GOALS: Target date: 02/16/2024  Patient will be I with initial HEP in order to progress with therapy. Baseline: HEP provided at eval 02/16/2024: independent with initial HEP Goal status: MET  2.  Patient will score </= 6/10 at worse on the NPS to show an improvement in pain allowing for an increase in functional ability.  Baseline: 8/10 02/16/2024: patient does report occasional severe pain that can wake her Goal status: PARTIALLY MET  LONG TERM GOALS: Target date: 03/15/2024  Patient will be independent with final HEP in order to continue treatment at home for pain tolerance.  Baseline: HEP provided at eval Goal status: INITIAL  2.  Patient will score </= 4/10 at worse on the NPS to show an improvement in pain allowing for an increase in functional ability.  Baseline: 8/10 Goal status: INITIAL  3.  Patient will score a 49/80 on the LEFS in order to show an improvement in  function according to MCID guidelines. Baseline: 39/80 Goal status: INITIAL  4.  Patient will increase left LE strength to a 4/5 to show an increase in LE strength to improve function  Baseline: see above Goal status: INITIAL  5. Patient will demonstrate ability to ambulate without gait deviations to improve community access  Baseline: gait deviations noted above  Goal status: INITIAL   PLAN: PT FREQUENCY: 2x/week  PT DURATION: 8 weeks  PLANNED INTERVENTIONS: 97164- PT Re-evaluation, 97110-Therapeutic exercises, 97530- Therapeutic activity, 97112- Neuromuscular re-education, 97535- Self Care, 36644- Manual therapy, [phone]- Gait training, [phone]- Aquatic Therapy, [phone]- Electrical stimulation (unattended), [phone]- Electrical stimulation (manual), Patient/Family education, Balance training, Stair training, Taping, Dry Needling, Joint mobilization, Joint manipulation, Spinal manipulation, Spinal mobilization, Cryotherapy, and Moist heat  PLAN FOR NEXT SESSION: Review HEP and progress PRN, continue taping for left knee if beneficial, progress hip, knee, core strengthening    Rosana Hoes, PT, DPT, LAT, ATC 02/18/24  9:35 AM Phone: [phone] Fax: [phone]

## 2024-02-22 ENCOUNTER — Ambulatory Visit: Attending: Orthopaedic Surgery

## 2024-02-22 DIAGNOSIS — M6281 Muscle weakness (generalized): Secondary | ICD-10-CM | POA: Insufficient documentation

## 2024-02-22 DIAGNOSIS — R2689 Other abnormalities of gait and mobility: Secondary | ICD-10-CM | POA: Insufficient documentation

## 2024-02-22 DIAGNOSIS — G8929 Other chronic pain: Secondary | ICD-10-CM | POA: Insufficient documentation

## 2024-02-22 DIAGNOSIS — M25562 Pain in left knee: Secondary | ICD-10-CM | POA: Insufficient documentation

## 2024-02-22 NOTE — Therapy (Signed)
 OUTPATIENT PHYSICAL THERAPY TREATMENT   Patient Name: Tanya Bowen MRN: [id number] DOB:[birth year], 58 y.o., female Today's Date: 02/22/2024   END OF SESSION:  PT End of Session - 02/22/24 1218     Visit Number 9    Number of Visits 17    Date for PT Re-Evaluation 03/15/24    Authorization Type [facility] [employer]    PT Start Time 1215    PT Stop Time 1254    PT Time Calculation (min) 39 min    Activity Tolerance Patient tolerated treatment well    Behavior During Therapy WFL for tasks assessed/performed                    Past Medical History:  Diagnosis Date   Diabetes mellitus without complication (HCC)    Hyperlipidemia    Past Surgical History:  Procedure Laterality Date   childbirth  1982   1986, 1998   COLONOSCOPY  10/2020   DILATION AND CURETTAGE OF UTERUS     Patient Active Problem List   Diagnosis Date Noted   Left knee pain 11/12/2023   Mild dysplasia of cervix (CIN I) 03/05/2017    PCP: Caesar Bookman, NP  REFERRING PROVIDER: Eldred Manges, MD   REFERRING DIAG: [phone] (ICD-10-CM) - Acute pain of left knee   THERAPY DIAG:  Chronic pain of left knee  Muscle weakness (generalized)  Other abnormalities of gait and mobility  Rationale for Evaluation and Treatment: Rehabilitation  ONSET DATE: Chronic, last year   SUBJECTIVE:  SUBJECTIVE STATEMENT: Patient reports that she is feeling improved stability and decreased swelling in her knee with use of exercises and previous taping sessions. She also began going to the gym over the past few weeks.    EVAL: Patient arrives at clinic with c/c of left knee pain and limited knee ROM especially in straightening. She states that last year she went to [place] and after the flight she felt a knee pain that has not gotten better. She was put on blood thinners after visiting the MD in [place] and cleared to come back to [place]. The same pain happened on the flight back. She got injections in January which she  feels like helped but she does not feel full relief. Starting this past Friday she got the pain again. Reports that she tried to go for a walk Thursday night and it could have created her pain. She notices after resting and keeping her knee in one position her pain gets much worse. She gets a lot of numbness and tingling and cramping in the left leg.   PERTINENT HISTORY: See PMH above  PAIN:  Are you having pain?  NPRS scale: 0/10 current, 8/10 worst Pain location: Left knee Pain description: Aching Aggravating factors: Sitting, increased activity Relieving factors: Exercises, cream, rest  PRECAUTIONS: None  PATIENT GOALS: Return to full ROM in knee, be able to walk longer distances.    OBJECTIVE:  Note: Objective measures were completed at Evaluation unless otherwise noted. PATIENT SURVEYS:  LEFS 39/80  MUSCLE LENGTH: Hamstrings: Right - WNL, L - 50%   POSTURE: weight shift right  PALPATION: Tenderness to piriformis, hamstring, posterior and lateral knee, gastroc  LOWER EXTREMITY MMT:  MMT Right eval Left eval Rt / Lt 02/16/2024  Hip flexion 4- 3   Hip extension     Hip abduction     Hip adduction     Hip internal rotation     Hip external rotation  Knee flexion 4- 3 4 / 4  Knee extension 4- 3 4 / 4  Ankle dorsiflexion 4 4-   Ankle plantarflexion 4 4-   Ankle inversion     Ankle eversion      (Blank rows = not tested)  LOWER EXTREMITY ROM:  AROM Right eval Left eval  Hip flexion    Hip extension    Hip abduction    Hip adduction    Hip internal rotation    Hip external rotation    Knee flexion 122 108  Knee extension -2 4  Ankle dorsiflexion    Ankle plantarflexion    Ankle inversion    Ankle eversion     (Blank rows = not tested)  LOWER EXTREMITY SPECIAL TESTS:  SLR: positive for neural tension  FUNCTIONAL TESTS:  30 seconds chair stand test: 8 sit to stands with hand support   GAIT: Distance walked: 40 Assistive device utilized:  None Level of assistance: Complete Independence Comments: antalgic gait,weight shift right, limited hip flexion, limited knee extension                                                                                           TREATMENT DATE:   [facility] Adult PT Treatment:                                                DATE: 02/22/2024  Therapeutic Exercise: Recumbent bike L3 x 5 min to improve endurance Cybex leg press DL 010# 3 x 10 Cybex leg press SL 60# 3 x 10  Neuromuscular re-ed: SLS on Airex 3 x 30 sec each  SLS cone taps (3) on airex 1 x 30 sec each   Therapeutic Activity: Lateral band walk with blue at ankles x 3 lap Updated HEP to promote independence with LQ program at gym; related patient education for updated POC.     [facility] Adult PT Treatment:                                                DATE: 02/18/2024 Recumbent bike L3 x 5 min to improve endurance Left patellar mobs all directions Modified thomas stretch with passive knee flexion 3 x 30 sec Cybex leg press DL 272# 3 x 10, SL 53# 2 x 10 Goblet squat to mat table with 15# 2 x 10 Lateral band walk with green at ankles x 3 lap SLS on Airex 3 x 30 sec   PATIENT EDUCATION:  Education details: HEP Person educated: Patient Education method: Programmer, multimedia, Facilities manager, Actor cues Education comprehension: verbalized understanding, returned demonstration, tactile cues required, and needs further education  HOME EXERCISE PROGRAM: Access: [email or web address]  New [employer] code (Access Code [email or web address]) as of 02/22/24 for HEP for progression to independent program at gym 2-3x/week    ASSESSMENT: CLINICAL IMPRESSION: 02/22/2024 Patient continues to respond well to current POC  without exacerbation of symptoms. She was able to increased repetitions with leg press in order to improve muscular endurance. She will continue to benefit from progression of exercises and HEP in order to promote independence. Plan is to decreased frequency from  2x/week to 1x/week as patient increases independent exercise at gym.    EVAL: Patient is a 58 y.o. female who was seen today for physical therapy evaluation and treatment for c/c of left knee, hip, and lower back pain. Patient reports the pain has been occurring for a few months since she traveled to [place] and got knee pain after sitting on the plane. She got a cortisone injection in January which she reports helped her some. She has a notable weight shift to the right side in sitting and walking. Notable knee extension restrictions in gait and supine during ROM. Neural tension noted on the left side in all positions, increased with DF and straightening of leg. Hamstring stretch helps relieve neural tension. Deficits in left sided strength and ROM noted which affect gait and seated posture. Overall, patient could benefit from hip, knee, and core strengthening to increase LE strength and decrease knee and back pain. Overall, skilled Pt would assist in increasing strength and ROM to decrease pain and increase functional activity.   OBJECTIVE IMPAIRMENTS: Abnormal gait, decreased activity tolerance, difficulty walking, decreased ROM, decreased strength, and pain.   ACTIVITY LIMITATIONS: bending, sitting, standing, squatting, sleeping, and locomotion level  PARTICIPATION LIMITATIONS: community activity and occupation  PERSONAL FACTORS: Time since onset of injury/illness/exacerbation and 1 comorbidity: see PMH  are also affecting patient's functional outcome.    GOALS: Goals reviewed with patient? Yes  SHORT TERM GOALS: Target date: 02/16/2024  Patient will be I with initial HEP in order to progress with therapy. Baseline: HEP provided at eval 02/16/2024: independent with initial HEP Goal status: MET  2.  Patient will score </= 6/10 at worse on the NPS to show an improvement in pain allowing for an increase in functional ability.  Baseline: 8/10 02/16/2024: patient does report occasional severe  pain that can wake her Goal status: PARTIALLY MET  LONG TERM GOALS: Target date: 03/15/2024  Patient will be independent with final HEP in order to continue treatment at home for pain tolerance.  Baseline: HEP provided at eval Goal status: INITIAL  2.  Patient will score </= 4/10 at worse on the NPS to show an improvement in pain allowing for an increase in functional ability.  Baseline: 8/10 Goal status: INITIAL  3.  Patient will score a 49/80 on the LEFS in order to show an improvement in function according to MCID guidelines. Baseline: 39/80 Goal status: INITIAL  4.  Patient will increase left LE strength to a 4/5 to show an increase in LE strength to improve function  Baseline: see above Goal status: INITIAL  5. Patient will demonstrate ability to ambulate without gait deviations to improve community access  Baseline: gait deviations noted above  Goal status: INITIAL   PLAN: PT FREQUENCY: 2x/week  PT DURATION: 8 weeks  PLANNED INTERVENTIONS: 97164- PT Re-evaluation, 97110-Therapeutic exercises, 97530- Therapeutic activity, 97112- Neuromuscular re-education, 97535- Self Care, 16109- Manual therapy, [phone]- Gait training, [phone]- Aquatic Therapy, [phone]- Electrical stimulation (unattended), [phone]- Electrical stimulation (manual), Patient/Family education, Balance training, Stair training, Taping, Dry Needling, Joint mobilization, Joint manipulation, Spinal manipulation, Spinal mobilization, Cryotherapy, and Moist heat  PLAN FOR NEXT SESSION: Review HEP and progress PRN, continue taping for left knee if beneficial, progress hip, knee, core strengthening  Mauri Reading, PT, DPT  02/22/2024 6:54 PM

## 2024-02-25 ENCOUNTER — Ambulatory Visit

## 2024-02-26 ENCOUNTER — Ambulatory Visit

## 2024-02-26 DIAGNOSIS — G8929 Other chronic pain: Secondary | ICD-10-CM | POA: Diagnosis not present

## 2024-02-26 DIAGNOSIS — M25562 Pain in left knee: Secondary | ICD-10-CM | POA: Diagnosis not present

## 2024-02-26 DIAGNOSIS — M6281 Muscle weakness (generalized): Secondary | ICD-10-CM | POA: Diagnosis not present

## 2024-02-26 DIAGNOSIS — R2689 Other abnormalities of gait and mobility: Secondary | ICD-10-CM | POA: Diagnosis not present

## 2024-02-26 NOTE — Therapy (Signed)
 OUTPATIENT PHYSICAL THERAPY TREATMENT   Patient Name: Tanya Bowen MRN: [id number] DOB:1966/09/12, 58 y.o., female Today's Date: 02/26/2024   END OF SESSION:  PT End of Session - 02/26/24 1039     Visit Number 10    Number of Visits 17    Date for PT Re-Evaluation 03/15/24    Authorization Type [facility] [employer]    PT Start Time 1030    PT Stop Time 1110    PT Time Calculation (min) 40 min    Activity Tolerance Patient tolerated treatment well    Behavior During Therapy WFL for tasks assessed/performed              Past Medical History:  Diagnosis Date   Diabetes mellitus without complication (HCC)    Hyperlipidemia    Past Surgical History:  Procedure Laterality Date   childbirth  1982   1986, 1998   COLONOSCOPY  10/2020   DILATION AND CURETTAGE OF UTERUS     Patient Active Problem List   Diagnosis Date Noted   Left knee pain 11/12/2023   Mild dysplasia of cervix (CIN I) 03/05/2017    PCP: Caesar Bookman, NP  REFERRING PROVIDER: Eldred Manges, MD   REFERRING DIAG: [id number] (ICD-10-CM) - Acute pain of left knee   THERAPY DIAG:  Chronic pain of left knee  Rationale for Evaluation and Treatment: Rehabilitation  ONSET DATE: Chronic, last year   SUBJECTIVE:  SUBJECTIVE STATEMENT: Patient reports that her knee pain is continuing to improve.   EVAL: Patient arrives at clinic with c/c of left knee pain and limited knee ROM especially in straightening. She states that last year she went to [place] and after the flight she felt a knee pain that has not gotten better. She was put on blood thinners after visiting the MD in [place] and cleared to come back to [place]. The same pain happened on the flight back. She got injections in January which she feels like helped but she does not feel full relief. Starting this past Friday she got the pain again. Reports that she tried to go for a walk Thursday night and it could have created her pain. She notices after resting and  keeping her knee in one position her pain gets much worse. She gets a lot of numbness and tingling and cramping in the left leg.   PERTINENT HISTORY: See PMH above  PAIN:  Are you having pain?  NPRS scale: 0/10 current, 8/10 worst Pain location: Left knee Pain description: Aching Aggravating factors: Sitting, increased activity Relieving factors: Exercises, cream, rest  PRECAUTIONS: None  PATIENT GOALS: Return to full ROM in knee, be able to walk longer distances.    OBJECTIVE:  Note: Objective measures were completed at Evaluation unless otherwise noted. PATIENT SURVEYS:  LEFS 39/80  MUSCLE LENGTH: Hamstrings: Right - WNL, L - 50%   POSTURE: weight shift right  PALPATION: Tenderness to piriformis, hamstring, posterior and lateral knee, gastroc  LOWER EXTREMITY MMT:  MMT Right eval Left eval Rt / Lt 02/16/2024  Hip flexion 4- 3   Hip extension     Hip abduction     Hip adduction     Hip internal rotation     Hip external rotation     Knee flexion 4- 3 4 / 4  Knee extension 4- 3 4 / 4  Ankle dorsiflexion 4 4-   Ankle plantarflexion 4 4-   Ankle inversion     Ankle eversion      (  Blank rows = not tested)  LOWER EXTREMITY ROM:  AROM Right eval Left eval  Hip flexion    Hip extension    Hip abduction    Hip adduction    Hip internal rotation    Hip external rotation    Knee flexion 122 108  Knee extension -2 4  Ankle dorsiflexion    Ankle plantarflexion    Ankle inversion    Ankle eversion     (Blank rows = not tested)  LOWER EXTREMITY SPECIAL TESTS:  SLR: positive for neural tension  FUNCTIONAL TESTS:  30 seconds chair stand test: 8 sit to stands with hand support   GAIT: Distance walked: 40 Assistive device utilized: None Level of assistance: Complete Independence Comments: antalgic gait,weight shift right, limited hip flexion, limited knee extension                                                                                            TREATMENT DATE:    [facility] Adult PT Treatment:                                                DATE: 02/26/2024  Therapeutic Exercise: Recumbent bike L2 x 6 min to improve endurance Cybex leg press DL 161# 3 x 10 Cybex leg press SL 60# 2 x 10 each  Omega knee extension 30# 2 x 10  Omega HS curl 35# 2 x 10   Therapeutic Activity: Lateral band walk with blue at ankles x 3 lap Reviewed HEP to promote independence with LQ program at gym; related patient education for updated POC.  Patient education regarding independent HEP progression and weaning use of KT tape as symptoms improve      PATIENT EDUCATION:  Education details: HEP Person educated: Patient Education method: Programmer, multimedia, Demonstration, Actor cues Education comprehension: verbalized understanding, returned demonstration, tactile cues required, and needs further education  HOME EXERCISE PROGRAM: Access: [email or web address]  New Medbridge code (Access Code [email or web address]) as of 02/22/24 for HEP for progression to independent program at gym 2-3x/week    ASSESSMENT: CLINICAL IMPRESSION: 02/26/2024 patient continues to respond well to current POC without exacerbation of symptoms.  She was able to tolerate increased resistance with knee extension and flexion machines today.  We spent additional time today reviewing HEP with the goal of improving independence 2-3 times per week at the gym.  Discussed proper progression of resistance versus repetitions for current rehab goals.  She will continue to benefit from skilled PT to further address remaining deficits.  However we will begin to decrease frequency to 1 time a week at this time.   EVAL: Patient is a 58 y.o. female who was seen today for physical therapy evaluation and treatment for c/c of left knee, hip, and lower back pain. Patient reports the pain has been occurring for a few months since she traveled to [place] and got knee pain after sitting on the plane. She got a cortisone injection in  January which she reports helped her  some. She has a notable weight shift to the right side in sitting and walking. Notable knee extension restrictions in gait and supine during ROM. Neural tension noted on the left side in all positions, increased with DF and straightening of leg. Hamstring stretch helps relieve neural tension. Deficits in left sided strength and ROM noted which affect gait and seated posture. Overall, patient could benefit from hip, knee, and core strengthening to increase LE strength and decrease knee and back pain. Overall, skilled Pt would assist in increasing strength and ROM to decrease pain and increase functional activity.   OBJECTIVE IMPAIRMENTS: Abnormal gait, decreased activity tolerance, difficulty walking, decreased ROM, decreased strength, and pain.   ACTIVITY LIMITATIONS: bending, sitting, standing, squatting, sleeping, and locomotion level  PARTICIPATION LIMITATIONS: community activity and occupation  PERSONAL FACTORS: Time since onset of injury/illness/exacerbation and 1 comorbidity: see PMH  are also affecting patient's functional outcome.    GOALS: Goals reviewed with patient? Yes  SHORT TERM GOALS: Target date: 02/16/2024  Patient will be I with initial HEP in order to progress with therapy. Baseline: HEP provided at eval 02/16/2024: independent with initial HEP Goal status: MET  2.  Patient will score </= 6/10 at worse on the NPS to show an improvement in pain allowing for an increase in functional ability.  Baseline: 8/10 02/16/2024: patient does report occasional severe pain that can wake her Goal status: PARTIALLY MET  LONG TERM GOALS: Target date: 03/15/2024  Patient will be independent with final HEP in order to continue treatment at home for pain tolerance.  Baseline: HEP provided at eval Goal status: INITIAL  2.  Patient will score </= 4/10 at worse on the NPS to show an improvement in pain allowing for an increase in functional ability.   Baseline: 8/10 Goal status: INITIAL  3.  Patient will score a 49/80 on the LEFS in order to show an improvement in function according to MCID guidelines. Baseline: 39/80 Goal status: INITIAL  4.  Patient will increase left LE strength to a 4/5 to show an increase in LE strength to improve function  Baseline: see above Goal status: INITIAL  5. Patient will demonstrate ability to ambulate without gait deviations to improve community access  Baseline: gait deviations noted above  Goal status: INITIAL   PLAN: PT FREQUENCY: 2x/week  PT DURATION: 8 weeks  PLANNED INTERVENTIONS: 97164- PT Re-evaluation, 97110-Therapeutic exercises, 97530- Therapeutic activity, 97112- Neuromuscular re-education, 97535- Self Care, 16109- Manual therapy, [phone]- Gait training, [phone]- Aquatic Therapy, [phone]- Electrical stimulation (unattended), [phone]- Electrical stimulation (manual), Patient/Family education, Balance training, Stair training, Taping, Dry Needling, Joint mobilization, Joint manipulation, Spinal manipulation, Spinal mobilization, Cryotherapy, and Moist heat  PLAN FOR NEXT SESSION: Review HEP and progress PRN, continue taping for left knee if beneficial, progress hip, knee, core strengthening    Mauri Reading, PT, DPT  02/26/2024 10:05 PM

## 2024-03-01 ENCOUNTER — Ambulatory Visit: Payer: Commercial Managed Care - PPO | Admitting: Dermatology

## 2024-03-01 ENCOUNTER — Encounter: Payer: Self-pay | Admitting: Dermatology

## 2024-03-01 ENCOUNTER — Ambulatory Visit

## 2024-03-01 VITALS — BP 119/78 | HR 102

## 2024-03-01 DIAGNOSIS — L309 Dermatitis, unspecified: Secondary | ICD-10-CM | POA: Diagnosis not present

## 2024-03-01 DIAGNOSIS — L811 Chloasma: Secondary | ICD-10-CM | POA: Diagnosis not present

## 2024-03-01 MED ORDER — PIMECROLIMUS 1 % EX CREA: TOPICAL_CREAM | Freq: Two times a day (BID) | CUTANEOUS | 4 refills | Status: AC

## 2024-03-01 NOTE — Progress Notes (Signed)
   New Patient Visit   Subjective  Tanya Bowen is a 58 y.o. female who presents for the following: hyperpigmentation  Patient states she has hyperpigmentation located at the face that she would like to have examined. Patient reports the areas have been there for 2 years. She reports the areas are not bothersome. She states that the areas have not spread. Patient reports she has previously been treated for these areas.Her previously prescribed Fluticasone Propionate which was ineffective. She has also tried good molecules Discoloration Correction Serum and daily use of Vani Cream Moisturizer with mineral sunscreen.   The following portions of the chart were reviewed this encounter and updated as appropriate: medications, allergies, medical history  Review of Systems:  No other skin or systemic complaints except as noted in HPI or Assessment and Plan.  Objective  Well appearing patient in no apparent distress; mood and affect are within normal limits.  A focused examination was performed of the following areas: Face  Relevant exam findings are noted in the Assessment and Plan.    Assessment & Plan   MELASMA Exam: reticulated hyperpigmented patches at face  Melasma is a chronic; persistent condition of hyperpigmented patches generally on the face, worse in summer due to higher UV exposure.    Heredity; thyroid disease; sun exposure; pregnancy; birth control pills; epilepsy medication and darker skin may predispose to Melasma.   Recommendations include: - Sun avoidance and daily broad spectrum (UVA/UVB) tinted mineral sunscreen SPF 30+, with Zinc or Titanium Dioxide.  - Assessment: Hyperpigmented patches on the face, likely secondary to the irritant dermatitis. Left side pigmentation worse than right, possibly due to sun exposure while driving.  - Plan:    Recommend Eucerin Radiant Tone with thiametol twice daily for discoloration    Encourage consistent and aggressive  sunscreen use    Apply CeraVe Sunscreen Stick while driving    Follow-up in 3 months with comparison photos    Irritant Dermatitis (Suspected Contact Dermatitis) - Assessment: 2-year history of dark patches on face with burning and irritating sensations. Burning precipitated by sweating and heat, localized to the face. Physical exam shows hyperpigmented patches and areas of prior irritation without active inflammation. Etiology of contact dermatitis remains unclear. Previous treatment with fluticasone cream provided relief for irritation and burning.  - Plan:    Discontinue fluticasone to avoid side effects of steroid overuse    Prescribe pimecrolimus, to be used twice daily for 5 days as needed for flares    Continue Vanicream cleansers, moisturizers, and sunscreen    Patient to maintain a diary of flare triggers and environmental exposures    Morning routine: Vanicream Cleanser, Pimecrolimus (if flaring), Eucerin Radiant Tone, Vanicream Sunscreen    Evening routine: Vanicream Cleanser, Eucerin Radiant Tone, Pimecrolimus (if flaring), Vanicream Moisturizer    Recommend CeraVe Sunscreen Stick for application while driving    Follow-up in 3 months with comparison photos    No follow-ups on file.  Tanya Bowen, CMA, am acting as scribe for [employer], DO.   Documentation: I have reviewed the above documentation for accuracy and completeness, and I agree with the above.  Tanya Reusing, DO

## 2024-03-01 NOTE — Patient Instructions (Addendum)
 Hello Tanya Bowen,  Thank you for visiting today. Here is a summary of the key instructions:  - Medications:   - Stop using fluticasone cream   - Start pimecrolimus (Elidel) cream: for irritation/burning     - Apply when skin feels sensitive or irritated     - For flares, use twice a day for 5 days   - Use Eucerin Radiant Tone with thiamidol: for dark spot     - Apply twice a day     - Stop if you have a reaction  - Skin Care Routine:   Morning:   - Wash with Vanicream cleanser   - Apply pimecrolimus if skin is irritated   - Apply Eucerin  Radiant Tone   - Apply sunscreen    Night:   - Wash with Vanicream cleanser   - Apply Eucerin Radiant Tone   - Apply Vanicream moisturizer  - Sun Protection:   - Keep sunscreen in your car   - Reapply sunscreen every 3 hours  - Other Instructions:   - Keep a diary of what triggers your skin flares   - Contact the office through MyChart if you have any issues  - Follow-up: Return for a follow-up appointment in 3 months  We look forward to seeing the positive changes in your next visit. If you have any questions or concerns before then, please do not hesitate to contact our office.  Warm regards,  Dr. [facility]               Important Information   Due to recent changes in healthcare laws, you may see results of your pathology and/or laboratory studies on MyChart before the doctors have had a chance to review them. We understand that in some cases there may be results that are confusing or concerning to you. Please understand that not all results are received at the same time and often the doctors may need to interpret multiple results in order to provide you with the best plan of care or course of treatment. Therefore, we ask that you please give [place] 2 business days to thoroughly review all your results before contacting the office for clarification. Should we see a critical lab result, you will be contacted  sooner.     If You Need Anything After Your Visit   If you have any questions or concerns for your doctor, please call our main line at [phone]. If no one answers, please leave a voicemail as directed and we will return your call as soon as possible. Messages left after 4 pm will be answered the following business day.    You may also send [place] a message via MyChart. We typically respond to MyChart messages within 1-2 business days.  For prescription refills, please ask your pharmacy to contact our office. Our fax number is [phone].  If you have an urgent issue when the clinic is closed that cannot wait until the next business day, you can page your doctor at the number below.     Please note that while we do our best to be available for urgent issues outside of office hours, we are not available 24/7.    If you have an urgent issue and are unable to reach [place], you may choose to seek medical care at your doctor's office, retail clinic, urgent care center, or emergency room.   If you have a medical emergency, please immediately call 911 or go to the emergency department. In the  event of inclement weather, please call our main line at [phone] for an update on the status of any delays or closures.  Dermatology Medication Tips: Please keep the boxes that topical medications come in in order to help keep track of the instructions about where and how to use these. Pharmacies typically print the medication instructions only on the boxes and not directly on the medication tubes.   If your medication is too expensive, please contact our office at [phone] or send [place] a message through MyChart.    We are unable to tell what your co-pay for medications will be in advance as this is different depending on your insurance coverage. However, we may be able to find a substitute medication at lower cost or fill out paperwork to get insurance to cover a needed medication.    If a prior  authorization is required to get your medication covered by your insurance company, please allow [place] 1-2 business days to complete this process.   Drug prices often vary depending on where the prescription is filled and some pharmacies may offer cheaper prices.   The website www.goodrx.com contains coupons for medications through different pharmacies. The prices here do not account for what the cost may be with help from insurance (it may be cheaper with your insurance), but the website can give you the price if you did not use any insurance.  - You can print the associated coupon and take it with your prescription to the pharmacy.  - You may also stop by our office during regular business hours and pick up a GoodRx coupon card.  - If you need your prescription sent electronically to a different pharmacy, notify our office through [facility] or by phone at [phone]

## 2024-03-02 ENCOUNTER — Ambulatory Visit

## 2024-03-02 DIAGNOSIS — M6281 Muscle weakness (generalized): Secondary | ICD-10-CM | POA: Diagnosis not present

## 2024-03-02 DIAGNOSIS — R2689 Other abnormalities of gait and mobility: Secondary | ICD-10-CM | POA: Diagnosis not present

## 2024-03-02 DIAGNOSIS — G8929 Other chronic pain: Secondary | ICD-10-CM | POA: Diagnosis not present

## 2024-03-02 DIAGNOSIS — M25562 Pain in left knee: Secondary | ICD-10-CM | POA: Diagnosis not present

## 2024-03-02 NOTE — Therapy (Signed)
 OUTPATIENT PHYSICAL THERAPY TREATMENT   Patient Name: Tanya Bowen MRN: [id number] DOB:Apr 06, 1966, 58 y.o., female Today's Date: 03/02/2024   END OF SESSION:     Past Medical History:  Diagnosis Date   Diabetes mellitus without complication (HCC)    Hyperlipidemia    Past Surgical History:  Procedure Laterality Date   childbirth  1982   1986, 1998   COLONOSCOPY  10/2020   DILATION AND CURETTAGE OF UTERUS     Patient Active Problem List   Diagnosis Date Noted   Left knee pain 11/12/2023   Mild dysplasia of cervix (CIN I) 03/05/2017    PCP: Caesar Bookman, NP  REFERRING PROVIDER: Eldred Manges, MD   REFERRING DIAG: [phone] (ICD-10-CM) - Acute pain of left knee   THERAPY DIAG:  No diagnosis found.  Rationale for Evaluation and Treatment: Rehabilitation  ONSET DATE: Chronic, last year   SUBJECTIVE:  SUBJECTIVE STATEMENT: Patient reports that her knee pain is continuing to improve.   EVAL: Patient arrives at clinic with c/c of left knee pain and limited knee ROM especially in straightening. She states that last year she went to [place] and after the flight she felt a knee pain that has not gotten better. She was put on blood thinners after visiting the MD in [place] and cleared to come back to [place]. The same pain happened on the flight back. She got injections in January which she feels like helped but she does not feel full relief. Starting this past Friday she got the pain again. Reports that she tried to go for a walk Thursday night and it could have created her pain. She notices after resting and keeping her knee in one position her pain gets much worse. She gets a lot of numbness and tingling and cramping in the left leg.   PERTINENT HISTORY: See PMH above  PAIN:  Are you having pain?  NPRS scale: 0/10 current, 8/10 worst Pain location: Left knee Pain description: Aching Aggravating factors: Sitting, increased activity Relieving factors: Exercises,  cream, rest  PRECAUTIONS: None  PATIENT GOALS: Return to full ROM in knee, be able to walk longer distances.    OBJECTIVE:  Note: Objective measures were completed at Evaluation unless otherwise noted. PATIENT SURVEYS:  LEFS 39/80  MUSCLE LENGTH: Hamstrings: Right - WNL, L - 50%   POSTURE: weight shift right  PALPATION: Tenderness to piriformis, hamstring, posterior and lateral knee, gastroc  LOWER EXTREMITY MMT:  MMT Right eval Left eval Rt / Lt 02/16/2024  Hip flexion 4- 3   Hip extension     Hip abduction     Hip adduction     Hip internal rotation     Hip external rotation     Knee flexion 4- 3 4 / 4  Knee extension 4- 3 4 / 4  Ankle dorsiflexion 4 4-   Ankle plantarflexion 4 4-   Ankle inversion     Ankle eversion      (Blank rows = not tested)  LOWER EXTREMITY ROM:  AROM Right eval Left eval  Hip flexion    Hip extension    Hip abduction    Hip adduction    Hip internal rotation    Hip external rotation    Knee flexion 122 108  Knee extension -2 4  Ankle dorsiflexion    Ankle plantarflexion    Ankle inversion    Ankle eversion     (Blank rows = not tested)  LOWER EXTREMITY SPECIAL TESTS:  SLR: positive for neural tension  FUNCTIONAL TESTS:  30 seconds chair stand test: 8 sit to stands with hand support   GAIT: Distance walked: 40 Assistive device utilized: None Level of assistance: Complete Independence Comments: antalgic gait,weight shift right, limited hip flexion, limited knee extension                                                                                           TREATMENT DATE:    [facility] Adult PT Treatment:                                                DATE: 03/02/2024  Therapeutic Exercise: Recumbent bike L2 x 5 min to improve endurance Cybex leg press DL 846# 3 x 10 Cybex leg press SL 60# 2 x 10 each  Omega knee extension 25# 3 x 10  Omega HS curl 40# 3 x 10   Therapeutic Activity: Lateral band walk with blue at  ankles x 3 lap Reviewed HEP to promote independence with LQ program at gym; related patient education for updated POC.  Patient education regarding independent HEP progression and weaning use of KT tape as symptoms improve  Mcconnell taping     [facility] Adult PT Treatment:                                                DATE: 02/26/2024  Therapeutic Exercise: Recumbent bike L2 x 6 min to improve endurance Cybex leg press DL 962# 3 x 10 Cybex leg press SL 60# 2 x 10 each  Omega knee extension 30# 2 x 10  Omega HS curl 35# 2 x 10   Therapeutic Activity: Lateral band walk with blue at ankles x 3 lap Reviewed HEP to promote independence with LQ program at gym; related patient education for updated POC.  Patient education regarding independent HEP progression and weaning use of KT tape as symptoms improve      PATIENT EDUCATION:  Education details: HEP Person educated: Patient Education method: Programmer, multimedia, Demonstration, Actor cues Education comprehension: verbalized understanding, returned demonstration, tactile cues required, and needs further education  HOME EXERCISE PROGRAM: Access: [email or web address]  New Medbridge code (Access Code [email or web address]) as of 02/22/24 for HEP for progression to independent program at gym 2-3x/week    ASSESSMENT: CLINICAL IMPRESSION: 03/02/2024 *** patient continues to respond well to current POC without exacerbation of symptoms.  She was able to tolerate increased resistance with knee extension and flexion machines today.  We spent additional time today reviewing HEP with the goal of improving independence 2-3 times per week at the gym.  Discussed proper progression of resistance versus repetitions for current rehab goals.  She will continue to benefit from skilled PT to further address remaining deficits.  However we will begin to decrease frequency to 1 time a week at this time.   EVAL: Patient is  a 58 y.o. female who was seen today for physical therapy evaluation and  treatment for c/c of left knee, hip, and lower back pain. Patient reports the pain has been occurring for a few months since she traveled to [place] and got knee pain after sitting on the plane. She got a cortisone injection in January which she reports helped her some. She has a notable weight shift to the right side in sitting and walking. Notable knee extension restrictions in gait and supine during ROM. Neural tension noted on the left side in all positions, increased with DF and straightening of leg. Hamstring stretch helps relieve neural tension. Deficits in left sided strength and ROM noted which affect gait and seated posture. Overall, patient could benefit from hip, knee, and core strengthening to increase LE strength and decrease knee and back pain. Overall, skilled Pt would assist in increasing strength and ROM to decrease pain and increase functional activity.   OBJECTIVE IMPAIRMENTS: Abnormal gait, decreased activity tolerance, difficulty walking, decreased ROM, decreased strength, and pain.   ACTIVITY LIMITATIONS: bending, sitting, standing, squatting, sleeping, and locomotion level  PARTICIPATION LIMITATIONS: community activity and occupation  PERSONAL FACTORS: Time since onset of injury/illness/exacerbation and 1 comorbidity: see PMH  are also affecting patient's functional outcome.    GOALS: Goals reviewed with patient? Yes  SHORT TERM GOALS: Target date: 02/16/2024  Patient will be I with initial HEP in order to progress with therapy. Baseline: HEP provided at eval 02/16/2024: independent with initial HEP Goal status: MET  2.  Patient will score </= 6/10 at worse on the NPS to show an improvement in pain allowing for an increase in functional ability.  Baseline: 8/10 02/16/2024: patient does report occasional severe pain that can wake her Goal status: PARTIALLY MET  LONG TERM GOALS: Target date: 03/15/2024  Patient will be independent with final HEP in order to continue  treatment at home for pain tolerance.  Baseline: HEP provided at eval Goal status: INITIAL  2.  Patient will score </= 4/10 at worse on the NPS to show an improvement in pain allowing for an increase in functional ability.  Baseline: 8/10 Goal status: INITIAL  3.  Patient will score a 49/80 on the LEFS in order to show an improvement in function according to MCID guidelines. Baseline: 39/80 Goal status: INITIAL  4.  Patient will increase left LE strength to a 4/5 to show an increase in LE strength to improve function  Baseline: see above Goal status: INITIAL  5. Patient will demonstrate ability to ambulate without gait deviations to improve community access  Baseline: gait deviations noted above  Goal status: INITIAL   PLAN: PT FREQUENCY: 2x/week  PT DURATION: 8 weeks  PLANNED INTERVENTIONS: 97164- PT Re-evaluation, 97110-Therapeutic exercises, 97530- Therapeutic activity, 97112- Neuromuscular re-education, 97535- Self Care, 91478- Manual therapy, [phone]- Gait training, [phone]- Aquatic Therapy, [phone]- Electrical stimulation (unattended), [phone]- Electrical stimulation (manual), Patient/Family education, Balance training, Stair training, Taping, Dry Needling, Joint mobilization, Joint manipulation, Spinal manipulation, Spinal mobilization, Cryotherapy, and Moist heat  PLAN FOR NEXT SESSION: Review HEP and progress PRN, continue taping for left knee if beneficial, progress hip, knee, core strengthening    Mauri Reading, PT, DPT  03/02/2024 5:03 PM

## 2024-03-03 ENCOUNTER — Ambulatory Visit

## 2024-03-03 ENCOUNTER — Ambulatory Visit: Admitting: Podiatry

## 2024-03-03 ENCOUNTER — Ambulatory Visit (INDEPENDENT_AMBULATORY_CARE_PROVIDER_SITE_OTHER)

## 2024-03-03 ENCOUNTER — Encounter: Payer: Self-pay | Admitting: Podiatry

## 2024-03-03 DIAGNOSIS — M216X2 Other acquired deformities of left foot: Secondary | ICD-10-CM

## 2024-03-03 DIAGNOSIS — M722 Plantar fascial fibromatosis: Secondary | ICD-10-CM | POA: Diagnosis not present

## 2024-03-03 DIAGNOSIS — M216X1 Other acquired deformities of right foot: Secondary | ICD-10-CM | POA: Diagnosis not present

## 2024-03-03 MED ORDER — MELOXICAM 15 MG PO TABS: 15.0000 mg | ORAL_TABLET | Freq: Every day | ORAL | 0 refills | Status: DC

## 2024-03-03 MED ORDER — TRIAMCINOLONE ACETONIDE 10 MG/ML IJ SUSP
10.0000 mg | Freq: Once | INTRAMUSCULAR | Status: AC
  Administered 2024-03-03: 10 mg

## 2024-03-03 NOTE — Progress Notes (Signed)
 Chief Complaint  Patient presents with   Foot Pain    "Sometime my heel gets so tender and it hurts.  I want both checked." N - heel pain L - bilateral, left >right D - 2 days O - suddenly C - tender A - standing for a period of time T - none   Nail Problem    "My nails have gotten so dark lately." N - toenails L - 1-5 bilateral D - 1-2 years O - gradually worse C - dark A - none T - none    HPI: 58 y.o. female presenting today with c/o pain in the bottom of the both heels, left greater than right heel.  Reports relatively new onset.  She reports this as a sharp stabbing pain.  It is worse after periods of rest.  She also does note that she has noticed some changes to her toenails and they are darkening in color.  Past Medical History:  Diagnosis Date   Diabetes mellitus without complication (HCC)    Hyperlipidemia     Past Surgical History:  Procedure Laterality Date   childbirth  1982   1986, 1998   COLONOSCOPY  10/2020   DILATION AND CURETTAGE OF UTERUS      No Known Allergies   Physical Exam: General: The patient is alert and oriented x3 in no acute distress.  Dermatology:  No ecchymosis, erythema, or edema bilateral.  No open lesions.  Some darkening to the nails without significant dystrophic changes. Some mild tenderness to nail borders of the great toes.  Vascular: Palpable pedal pulses bilaterally. Capillary refill within normal limits.  No appreciable edema.    Neurological: Light touch sensation intact bilateral.  MMT 5/5 to lower extremity bilateral. Negative Tinel's sign with percussion of the posterior tibial nerve on the affected extremity.    Musculoskeletal Exam:  There is pain on palpation of the plantarmedial & plantarcentral aspect of left and right heel.  Left is more significant.  No gaps or nodules within the plantar fascia.  Positive Windlass mechanism bilateral.  Antalgic gait noted with first few steps upon standing.  No pain on  palpation of achilles tendon bilateral.  Ankle df less than 10 degrees with knee extended b/l.  Radiographic Exam: Left and right foot radiographs 3 views 03/03/24 Normal osseous mineralization. Joint spaces preserved.  No fractures or osseous irregularities noted.  Assessment/Plan of Care: 1. Plantar fasciitis, bilateral   2. Acquired equinus deformity of both feet     Meds ordered this encounter  Medications   triamcinolone acetonide (KENALOG) 10 MG/ML injection 10 mg   meloxicam (MOBIC) 15 MG tablet    Sig: Take 1 tablet (15 mg total) by mouth daily.    Dispense:  30 tablet    Refill:  0   None  -Reviewed etiology of plantar fasciitis with patient.  Radiographs reviewed with patient discussed treatment options with patient today, including cortisone injection, NSAID course of treatment, stretching exercises, physical therapy, use of night splint, rest, icing the heel, arch supports/orthotics, and supportive shoe gear.    With the patient's verbal consent, a corticosteroid injection was administered to the left heel, consisting of a mixture of 1% lidocaine plain, 0.5% Sensorcaine plain, and Kenalog-10 for a total of 1.5cc administered.  A Band-aid was applied.  Patient tolerated this well.  She would like to defer this for the right side as it is not as severe  Deferring fascial brace and over-the-counter  inserts for now.  Return in about 3 weeks (around 03/24/2024) for Plantar Fasciitis.   Samin Milke L. Lunda Salines, AACFAS [facility]     [address] [place], [place] [address]                Office [phone]  Fax [phone]

## 2024-03-03 NOTE — Patient Instructions (Signed)

## 2024-03-14 ENCOUNTER — Encounter: Admitting: Physical Therapy

## 2024-03-16 ENCOUNTER — Encounter

## 2024-03-18 ENCOUNTER — Ambulatory Visit

## 2024-03-18 DIAGNOSIS — M6281 Muscle weakness (generalized): Secondary | ICD-10-CM | POA: Diagnosis not present

## 2024-03-18 DIAGNOSIS — R2689 Other abnormalities of gait and mobility: Secondary | ICD-10-CM | POA: Diagnosis not present

## 2024-03-18 DIAGNOSIS — G8929 Other chronic pain: Secondary | ICD-10-CM | POA: Diagnosis not present

## 2024-03-18 DIAGNOSIS — M25562 Pain in left knee: Secondary | ICD-10-CM | POA: Diagnosis not present

## 2024-03-18 NOTE — Therapy (Signed)
 OUTPATIENT PHYSICAL THERAPY TREATMENT   Patient Name: Tanya Bowen MRN: [id number] DOB:[birth year], 58 y.o., female Today's Date: 03/21/2024   END OF SESSION:   PT End of Session - 03/18/24 1215      Visit Number 12    Number of Visits 17     Date for PT Re-Evaluation 03/15/24     Authorization Type [facility] [employer]     PT Start Time 1115    PT Stop Time 1155    PT Time Calculation (min) 40 min     Activity Tolerance Patient tolerated treatment well       Past Medical History:  Diagnosis Date   Diabetes mellitus without complication (HCC)    Hyperlipidemia    Past Surgical History:  Procedure Laterality Date   childbirth  1982   1986, 1998   COLONOSCOPY  10/2020   DILATION AND CURETTAGE OF UTERUS     Patient Active Problem List   Diagnosis Date Noted   Left knee pain 11/12/2023   Mild dysplasia of cervix (CIN I) 03/05/2017    PCP: Estil Heman, NP  REFERRING PROVIDER: Adah Acron, MD   REFERRING DIAG: [phone] (ICD-10-CM) - Acute pain of left knee   THERAPY DIAG:  Chronic pain of left knee  Muscle weakness (generalized)  Other abnormalities of gait and mobility  Rationale for Evaluation and Treatment: Rehabilitation  ONSET DATE: Chronic, last year   SUBJECTIVE:  SUBJECTIVE STATEMENT: Patient reports that her knee pain increased after more walking and traveling over the holiday weekend.    EVAL: Patient arrives at clinic with c/c of left knee pain and limited knee ROM especially in straightening. She states that last year she went to [place] and after the flight she felt a knee pain that has not gotten better. She was put on blood thinners after visiting the MD in [place] and cleared to come back to [place]. The same pain happened on the flight back. She got injections in January which she feels like helped but she does not feel full relief. Starting this past Friday she got the pain again. Reports that she tried to go for a walk Thursday night and it  could have created her pain. She notices after resting and keeping her knee in one position her pain gets much worse. She gets a lot of numbness and tingling and cramping in the left leg.   PERTINENT HISTORY: See PMH above  PAIN:  Are you having pain?  NPRS scale: 0/10 current, 8/10 worst Pain location: Left knee Pain description: Aching Aggravating factors: Sitting, increased activity Relieving factors: Exercises, cream, rest  PRECAUTIONS: None  PATIENT GOALS: Return to full ROM in knee, be able to walk longer distances.    OBJECTIVE:  Note: Objective measures were completed at Evaluation unless otherwise noted. PATIENT SURVEYS:  LEFS 39/80  MUSCLE LENGTH: Hamstrings: Right - WNL, L - 50%   POSTURE: weight shift right  PALPATION: Tenderness to piriformis, hamstring, posterior and lateral knee, gastroc  LOWER EXTREMITY MMT:  MMT Right eval Left eval Rt / Lt 02/16/2024  Hip flexion 4- 3   Hip extension     Hip abduction     Hip adduction     Hip internal rotation     Hip external rotation     Knee flexion 4- 3 4 / 4  Knee extension 4- 3 4 / 4  Ankle dorsiflexion 4 4-   Ankle plantarflexion 4 4-   Ankle inversion  Ankle eversion      (Blank rows = not tested)  LOWER EXTREMITY ROM:  AROM Right eval Left eval  Hip flexion    Hip extension    Hip abduction    Hip adduction    Hip internal rotation    Hip external rotation    Knee flexion 122 108  Knee extension -2 4  Ankle dorsiflexion    Ankle plantarflexion    Ankle inversion    Ankle eversion     (Blank rows = not tested)  LOWER EXTREMITY SPECIAL TESTS:  SLR: positive for neural tension  FUNCTIONAL TESTS:  30 seconds chair stand test: 8 sit to stands with hand support   GAIT: Distance walked: 40 Assistive device utilized: None Level of assistance: Complete Independence Comments: antalgic gait,weight shift right, limited hip flexion, limited knee extension                                                                                            TREATMENT DATE:    [facility] Adult PT Treatment:                                                DATE: 03/18/2024  Therapeutic Exercise: Recumbent bike L3 x 5 min at start of session Cybex leg press DL 244# 3 x 10 Cybex leg press SL 60# 3 x 10 each  Omega knee extension 25# 3 x 10  Omega HS curl 40# 3 x 10  Standing mini lunges, 2 x 10  Goblet Squat 2x10 with 15# KB  [facility] Adult PT Treatment:                                                DATE: 03/02/2024  Therapeutic Exercise: Recumbent bike L2 x 5 min to improve endurance Cybex leg press DL 010# 3 x 10 Omega knee extension 25# 3 x 10  Omega HS curl 40# 3 x 10  Standing mini lunges, 2 x 10  Goblet Squat 2x10 with 15# KB  Self-Care:  McConnell taping with leukotape neuromuscular feedback Patient education regarding use of taping   [facility] Adult PT Treatment:                                                DATE: 02/26/2024  Therapeutic Exercise: Recumbent bike L2 x 6 min to improve endurance Cybex leg press DL 272# 3 x 10 Cybex leg press SL 60# 3 x 10 each  Omega knee extension 30# 2 x 10  Omega HS curl 35# 2 x 10   Therapeutic Activity: Lateral band walk with blue at ankles x 3 lap Reviewed HEP to promote independence with LQ program at gym; related patient education for updated  POC.  Patient education regarding independent HEP progression and weaning use of KT tape as symptoms improve      PATIENT EDUCATION:  Education details: HEP Person educated: Patient Education method: Programmer, multimedia, Facilities manager, Actor cues Education comprehension: verbalized understanding, returned demonstration, tactile cues required, and needs further education  HOME EXERCISE PROGRAM: Access: [email or web address]  New Medbridge code (Access Code [email or web address]) as of 02/22/24 for HEP for progression to independent program at gym 2-3x/week    ASSESSMENT: CLINICAL IMPRESSION: 03/21/2024 Tanya Bowen had good  tolerance of today's treatment session, which focused on progression of LE strengthening program. Plan is to discharged at next appointment with updated HEP to improve independence with maintenance and pain modulation when traveling.     EVAL: Patient is a 58 y.o. female who was seen today for physical therapy evaluation and treatment for c/c of left knee, hip, and lower back pain. Patient reports the pain has been occurring for a few months since she traveled to [place] and got knee pain after sitting on the plane. She got a cortisone injection in January which she reports helped her some. She has a notable weight shift to the right side in sitting and walking. Notable knee extension restrictions in gait and supine during ROM. Neural tension noted on the left side in all positions, increased with DF and straightening of leg. Hamstring stretch helps relieve neural tension. Deficits in left sided strength and ROM noted which affect gait and seated posture. Overall, patient could benefit from hip, knee, and core strengthening to increase LE strength and decrease knee and back pain. Overall, skilled Pt would assist in increasing strength and ROM to decrease pain and increase functional activity.   OBJECTIVE IMPAIRMENTS: Abnormal gait, decreased activity tolerance, difficulty walking, decreased ROM, decreased strength, and pain.   ACTIVITY LIMITATIONS: bending, sitting, standing, squatting, sleeping, and locomotion level  PARTICIPATION LIMITATIONS: community activity and occupation  PERSONAL FACTORS: Time since onset of injury/illness/exacerbation and 1 comorbidity: see PMH  are also affecting patient's functional outcome.    GOALS: Goals reviewed with patient? Yes  SHORT TERM GOALS: Target date: 02/16/2024  Patient will be I with initial HEP in order to progress with therapy. Baseline: HEP provided at eval 02/16/2024: independent with initial HEP Goal status: MET  2.  Patient will score </= 6/10  at worse on the NPS to show an improvement in pain allowing for an increase in functional ability.  Baseline: 8/10 02/16/2024: patient does report occasional severe pain that can wake her Goal status: PARTIALLY MET  LONG TERM GOALS: Target date: 03/15/2024  Patient will be independent with final HEP in order to continue treatment at home for pain tolerance.  Baseline: HEP provided at eval Goal status: INITIAL  2.  Patient will score </= 4/10 at worse on the NPS to show an improvement in pain allowing for an increase in functional ability.  Baseline: 8/10 Goal status: INITIAL  3.  Patient will score a 49/80 on the LEFS in order to show an improvement in function according to MCID guidelines. Baseline: 39/80 Goal status: INITIAL  4.  Patient will increase left LE strength to a 4/5 to show an increase in LE strength to improve function  Baseline: see above Goal status: INITIAL  5. Patient will demonstrate ability to ambulate without gait deviations to improve community access  Baseline: gait deviations noted above  Goal status: INITIAL   PLAN: PT FREQUENCY: 2x/week  PT DURATION: 8 weeks  PLANNED INTERVENTIONS: 97164- PT Re-evaluation, 97110-Therapeutic exercises, 97530- Therapeutic  activity, [id number]- Neuromuscular re-education, [phone]- Self Care, 60454- Manual therapy, [phone]- Gait training, [phone]- Aquatic Therapy, [phone]- Electrical stimulation (unattended), [phone]- Electrical stimulation (manual), Patient/Family education, Balance training, Stair training, Taping, Dry Needling, Joint mobilization, Joint manipulation, Spinal manipulation, Spinal mobilization, Cryotherapy, and Moist heat  PLAN FOR NEXT SESSION: Review HEP and progress PRN, continue taping for left knee if beneficial, progress hip, knee, core strengthening    Arlester Bence, PT, DPT  03/21/2024 11:24 AM

## 2024-03-21 ENCOUNTER — Encounter

## 2024-03-22 ENCOUNTER — Other Ambulatory Visit: Payer: Commercial Managed Care - PPO

## 2024-03-22 DIAGNOSIS — E1100 Type 2 diabetes mellitus with hyperosmolarity without nonketotic hyperglycemic-hyperosmolar coma (NKHHC): Secondary | ICD-10-CM | POA: Diagnosis not present

## 2024-03-22 DIAGNOSIS — E782 Mixed hyperlipidemia: Secondary | ICD-10-CM

## 2024-03-23 ENCOUNTER — Encounter: Payer: Self-pay | Admitting: Nurse Practitioner

## 2024-03-23 ENCOUNTER — Encounter

## 2024-03-23 LAB — CBC WITH DIFFERENTIAL/PLATELET
Absolute Lymphocytes: 2614 {cells}/uL (ref 850–3900)
Absolute Monocytes: 454 {cells}/uL (ref 200–950)
Basophils Absolute: 72 {cells}/uL (ref 0–200)
Basophils Relative: 1 %
Eosinophils Absolute: 158 {cells}/uL (ref 15–500)
Eosinophils Relative: 2.2 %
HCT: 44.2 % (ref 35.0–45.0)
Hemoglobin: 14 g/dL (ref 11.7–15.5)
MCH: 27.6 pg (ref 27.0–33.0)
MCHC: 31.7 g/dL — ABNORMAL LOW (ref 32.0–36.0)
MCV: 87 fL (ref 80.0–100.0)
MPV: 10.7 fL (ref 7.5–12.5)
Monocytes Relative: 6.3 %
Neutro Abs: 3902 {cells}/uL (ref 1500–7800)
Neutrophils Relative %: 54.2 %
Platelets: 348 10*3/uL (ref 140–400)
RBC: 5.08 10*6/uL (ref 3.80–5.10)
RDW: 13.1 % (ref 11.0–15.0)
Total Lymphocyte: 36.3 %
WBC: 7.2 10*3/uL (ref 3.8–10.8)

## 2024-03-23 LAB — LIPID PANEL
Cholesterol: 220 mg/dL — ABNORMAL HIGH (ref <200)
HDL: 70 mg/dL (ref >=50)
LDL Cholesterol (Calc): 131 mg/dL — ABNORMAL HIGH
Non-HDL Cholesterol (Calc): 150 mg/dL — ABNORMAL HIGH (ref <130)
Total CHOL/HDL Ratio: 3.1 (calc) (ref <5.0)
Triglycerides: 91 mg/dL (ref <150)

## 2024-03-23 LAB — HEMOGLOBIN A1C
Hgb A1c MFr Bld: 6.5 % — ABNORMAL HIGH (ref <5.7)
Mean Plasma Glucose: 140 mg/dL
eAG (mmol/L): 7.7 mmol/L

## 2024-03-23 LAB — COMPLETE METABOLIC PANEL WITHOUT GFR
AG Ratio: 1.4 (calc) (ref 1.0–2.5)
ALT: 16 U/L (ref 6–29)
AST: 15 U/L (ref 10–35)
Albumin: 4.2 g/dL (ref 3.6–5.1)
Alkaline phosphatase (APISO): 95 U/L (ref 37–153)
BUN: 22 mg/dL (ref 7–25)
CO2: 26 mmol/L (ref 20–32)
Calcium: 9.7 mg/dL (ref 8.6–10.4)
Chloride: 104 mmol/L (ref 98–110)
Creat: 0.95 mg/dL (ref 0.50–1.03)
Globulin: 3 g/dL (ref 1.9–3.7)
Glucose, Bld: 107 mg/dL — ABNORMAL HIGH (ref 65–99)
Potassium: 4.6 mmol/L (ref 3.5–5.3)
Sodium: 139 mmol/L (ref 135–146)
Total Bilirubin: 0.4 mg/dL (ref 0.2–1.2)
Total Protein: 7.2 g/dL (ref 6.1–8.1)

## 2024-03-23 LAB — TSH: TSH: 2.88 m[IU]/L (ref 0.40–4.50)

## 2024-03-25 ENCOUNTER — Ambulatory Visit: Payer: Self-pay | Attending: Family

## 2024-03-25 DIAGNOSIS — M6281 Muscle weakness (generalized): Secondary | ICD-10-CM | POA: Insufficient documentation

## 2024-03-25 DIAGNOSIS — G8929 Other chronic pain: Secondary | ICD-10-CM | POA: Diagnosis not present

## 2024-03-25 DIAGNOSIS — R2689 Other abnormalities of gait and mobility: Secondary | ICD-10-CM | POA: Diagnosis not present

## 2024-03-25 DIAGNOSIS — M25562 Pain in left knee: Secondary | ICD-10-CM | POA: Insufficient documentation

## 2024-03-25 NOTE — Therapy (Signed)
 OUTPATIENT PHYSICAL THERAPY NOTE   Patient Name: Tanya Bowen MRN: [id number] DOB:[birth year], 58 y.o., female Today's Date: 03/25/2024  PHYSICAL THERAPY DISCHARGE SUMMARY  Visits from Start of Care:    Current functional level related to goals / functional outcomes: See objective findings/assessment  Remaining deficits: See objective findings/assessment    Education / Equipment: See today's treatment/assessment      Patient agrees to discharge. Patient goals were met. Patient is being discharged due to being pleased with the current functional level.     END OF SESSION:  PT End of Session - 03/25/24 1054     Visit Number 13    Number of Visits 17    Date for PT Re-Evaluation 03/15/24    Authorization Type [facility] [employer]    PT Start Time 0945    PT Stop Time 1025    PT Time Calculation (min) 40 min    Activity Tolerance Patient tolerated treatment well    Behavior During Therapy [facility] for tasks assessed/performed             PT End of Session - 03/25/24 1054     Visit Number 13    Number of Visits 17    Date for PT Re-Evaluation 03/15/24    Authorization Type [facility] [employer]    PT Start Time 0945    PT Stop Time 1025    PT Time Calculation (min) 40 min    Activity Tolerance Patient tolerated treatment well    Behavior During Therapy WFL for tasks assessed/performed                 Past Medical History:  Diagnosis Date   Diabetes mellitus without complication (HCC)    Hyperlipidemia    Past Surgical History:  Procedure Laterality Date   childbirth  1982   1986, 1998   COLONOSCOPY  10/2020   DILATION AND CURETTAGE OF UTERUS     Patient Active Problem List   Diagnosis Date Noted   Left knee pain 11/12/2023   Mild dysplasia of cervix (CIN I) 03/05/2017    PCP: Estil Heman, NP  REFERRING PROVIDER: Adah Acron, MD   REFERRING DIAG: [phone] (ICD-10-CM) - Acute pain of left knee   THERAPY DIAG:  Chronic pain of left knee  Muscle weakness  (generalized)  Other abnormalities of gait and mobility  Rationale for Evaluation and Treatment: Rehabilitation  ONSET DATE: Chronic, last year   SUBJECTIVE:  SUBJECTIVE STATEMENT: 03/25/2024 Patient reports that her pain has been better over the past week. She would like updated HEP as she plans to go to the gym more often after discharged from PT today.    EVAL: Patient arrives at clinic with c/c of left knee pain and limited knee ROM especially in straightening. She states that last year she went to [place] and after the flight she felt a knee pain that has not gotten better. She was put on blood thinners after visiting the MD in [place] and cleared to come back to [place]. The same pain happened on the flight back. She got injections in January which she feels like helped but she does not feel full relief. Starting this past Friday she got the pain again. Reports that she tried to go for a walk Thursday night and it could have created her pain. She notices after resting and keeping her knee in one position her pain gets much worse. She gets a lot of numbness and tingling and cramping in the left  leg.   PERTINENT HISTORY: See PMH above  PAIN:  Are you having pain?  NPRS scale: 0/10 current, 8/10 worst Pain location: Left knee Pain description: Aching Aggravating factors: Sitting, increased activity Relieving factors: Exercises, cream, rest  PRECAUTIONS: None  PATIENT GOALS: Return to full ROM in knee, be able to walk longer distances.    OBJECTIVE:  Note: Objective measures were completed at Evaluation unless otherwise noted. PATIENT SURVEYS:  LEFS 39/80  MUSCLE LENGTH: Hamstrings: Right - WNL, L - 50%   POSTURE: weight shift right  PALPATION: Tenderness to piriformis, hamstring, posterior and lateral knee, gastroc  LOWER EXTREMITY MMT:  MMT Right eval Left eval Rt / Lt 02/16/2024  Hip flexion 4- 3   Hip extension     Hip abduction     Hip adduction     Hip  internal rotation     Hip external rotation     Knee flexion 4- 3 4 / 4  Knee extension 4- 3 4 / 4  Ankle dorsiflexion 4 4-   Ankle plantarflexion 4 4-   Ankle inversion     Ankle eversion      (Blank rows = not tested)  LOWER EXTREMITY ROM:  AROM Right eval Left eval  Hip flexion    Hip extension    Hip abduction    Hip adduction    Hip internal rotation    Hip external rotation    Knee flexion 122 108  Knee extension -2 4  Ankle dorsiflexion    Ankle plantarflexion    Ankle inversion    Ankle eversion     (Blank rows = not tested)  LOWER EXTREMITY SPECIAL TESTS:  SLR: positive for neural tension  FUNCTIONAL TESTS:  30 seconds chair stand test: 8 sit to stands with hand support   GAIT: Distance walked: 40 Assistive device utilized: None Level of assistance: Complete Independence Comments: antalgic gait,weight shift right, limited hip flexion, limited knee extension                                                                                           TREATMENT DATE:     [facility] Adult PT Treatment:                                                DATE: 03/25/2024  Therapeutic Exercise: Recumbent bike L3 x 6 min at start of session Omega knee extension 25# 2 x 10  Omega HS curl 40# 3 x 10  Standing reverse lunges with slider 2 x 8 each  Standing lateral lunges with slider 2 x 8 each    Therapeutic Activity:  Reassessment of objective measures and subjective assessment regarding progress towards established goals and plan for independence with prescribed home program following dischargefrom PT      PATIENT EDUCATION:  Education details: HEP Person educated: Patient Education method: Explanation, Demonstration, Actor cues Education comprehension: verbalized understanding, returned demonstration, tactile cues required, and needs further education  HOME EXERCISE PROGRAM: Access  Code: [email or web address] URL: [email or web address] Date:  03/25/2024 Prepared by: Arlester Bence  Exercises - Heel Toe Raises with Counter Support  - 3 x weekly - 2 sets - 10-15 reps - Goblet Squat with Kettlebell  - 3 x weekly - 2 sets - 10-15 reps - Eccentric Knee Extension with Weight Machine  - 3 x weekly - 2 sets - 10-15 reps - Hamstring Curl with Weight Machine  - 3 x weekly - 2 sets - 10-15 reps - Full Leg Press  - 3 x weekly - 2 sets - 10-15 reps - Single Leg Press  - 3 x weekly - 2 sets - 10-15 reps - Lateral Lunge  - 3 x weekly - 2 sets - 10-15 reps - Reverse Lunge  - 3 x weekly - 2 sets - 10-15 reps   ASSESSMENT: CLINICAL IMPRESSION: 03/25/2024 Vaishnavi has attended 12 visits since initial evaluation for left knee pain and difficulty walking. Patient has made good progress with ROM, strength, and overall activity tolerance. She has met all established rehab goals. Updated and reviewed HEP for independence following discharge from PT today. Patient will be discharged from skilled PT at this time and should follow up with referring provider as needed.      EVAL: Patient is a 58 y.o. female who was seen today for physical therapy evaluation and treatment for c/c of left knee, hip, and lower back pain. Patient reports the pain has been occurring for a few months since she traveled to [place] and got knee pain after sitting on the plane. She got a cortisone injection in January which she reports helped her some. She has a notable weight shift to the right side in sitting and walking. Notable knee extension restrictions in gait and supine during ROM. Neural tension noted on the left side in all positions, increased with DF and straightening of leg. Hamstring stretch helps relieve neural tension. Deficits in left sided strength and ROM noted which affect gait and seated posture. Overall, patient could benefit from hip, knee, and core strengthening to increase LE strength and decrease knee and back pain. Overall, skilled Pt would assist in increasing  strength and ROM to decrease pain and increase functional activity.   OBJECTIVE IMPAIRMENTS: Abnormal gait, decreased activity tolerance, difficulty walking, decreased ROM, decreased strength, and pain.   ACTIVITY LIMITATIONS: bending, sitting, standing, squatting, sleeping, and locomotion level  PARTICIPATION LIMITATIONS: community activity and occupation  PERSONAL FACTORS: Time since onset of injury/illness/exacerbation and 1 comorbidity: see PMH  are also affecting patient's functional outcome.    GOALS: Goals reviewed with patient? Yes  SHORT TERM GOALS: Target date: 02/16/2024  Patient will be I with initial HEP in order to progress with therapy. Baseline: HEP provided at eval 02/16/2024: independent with initial HEP Goal status: MET  2.  Patient will score </= 6/10 at worse on the NPS to show an improvement in pain allowing for an increase in functional ability.  Baseline: 8/10 02/16/2024: patient does report occasional severe pain that can wake her 03/25/24: worst pain is 4.5-5/10 "but that's only some days"  Goal status: MET  LONG TERM GOALS: Target date: 03/15/2024  Patient will be independent with final HEP in order to continue treatment at home for pain tolerance.  Baseline: HEP provided at eval Goal status: MET  2.  Patient will score </= 4/10 at worse on the NPS to show an improvement in pain allowing for an increase in functional ability.  Baseline: 8/10 03/25/24: worst pain is 4.5-5/10 "  but that's only some days"  Goal status:nearly MET  3.  Patient will score a 49/80 on the LEFS in order to show an improvement in function according to MCID guidelines. Baseline: 39/80 03/25/24: 70/80 Goal status: MET  4.  Patient will increase left LE strength to a 4/5 to show an increase in LE strength to improve function  Baseline: see above Goal status: MET  5. Patient will demonstrate ability to ambulate without gait deviations to improve community access  Baseline: gait  deviations noted above  Goal status:MET   PLAN: PT FREQUENCY: 2x/week  PT DURATION: 8 weeks  PLANNED INTERVENTIONS: 97164- PT Re-evaluation, 97110-Therapeutic exercises, 97530- Therapeutic activity, 97112- Neuromuscular re-education, 97535- Self Care, 66440- Manual therapy, [phone]- Gait training, [phone]- Aquatic Therapy, [phone]- Electrical stimulation (unattended), [phone]- Electrical stimulation (manual), Patient/Family education, Balance training, Stair training, Taping, Dry Needling, Joint mobilization, Joint manipulation, Spinal manipulation, Spinal mobilization, Cryotherapy, and Moist heat      Arlester Bence, PT, DPT  03/25/2024 10:57 AM

## 2024-03-31 ENCOUNTER — Ambulatory Visit: Admitting: Family

## 2024-03-31 ENCOUNTER — Ambulatory Visit: Payer: Commercial Managed Care - PPO | Admitting: Family

## 2024-03-31 ENCOUNTER — Encounter: Payer: Self-pay | Admitting: Family

## 2024-03-31 VITALS — BP 132/84 | HR 87 | Temp 97.6°F | Ht 64.0 in | Wt 253.2 lb

## 2024-03-31 DIAGNOSIS — Z1211 Encounter for screening for malignant neoplasm of colon: Secondary | ICD-10-CM | POA: Diagnosis not present

## 2024-03-31 DIAGNOSIS — G8929 Other chronic pain: Secondary | ICD-10-CM

## 2024-03-31 DIAGNOSIS — E1100 Type 2 diabetes mellitus with hyperosmolarity without nonketotic hyperglycemic-hyperosmolar coma (NKHHC): Secondary | ICD-10-CM | POA: Diagnosis not present

## 2024-03-31 DIAGNOSIS — E782 Mixed hyperlipidemia: Secondary | ICD-10-CM | POA: Diagnosis not present

## 2024-03-31 DIAGNOSIS — M25562 Pain in left knee: Secondary | ICD-10-CM

## 2024-03-31 DIAGNOSIS — M722 Plantar fascial fibromatosis: Secondary | ICD-10-CM | POA: Diagnosis not present

## 2024-03-31 MED ORDER — GLIPIZIDE 5 MG PO TABS
5.0000 mg | ORAL_TABLET | Freq: Two times a day (BID) | ORAL | 3 refills | Status: AC
Start: 2024-03-31 — End: ?

## 2024-03-31 MED ORDER — MELOXICAM 15 MG PO TABS: 15.0000 mg | ORAL_TABLET | Freq: Every day | ORAL | 3 refills | Status: AC

## 2024-03-31 MED ORDER — IBUPROFEN 800 MG PO TABS: 800.0000 mg | ORAL_TABLET | Freq: Three times a day (TID) | ORAL | 0 refills | Status: AC | PRN

## 2024-03-31 NOTE — Progress Notes (Signed)
 Provider: Christean Courts FNP-C   Jeanenne Licea, Elijio Guadeloupe, NP  Patient Care Team: Daianna Vasques, Elijio Guadeloupe, NP as PCP - General (Family Medicine)  Extended Emergency Contact Information Primary Emergency Contact: Cox,Denicia  [place] Mobile Phone: [phone] Relation: Daughter  Code Status:  Full Code  Goals of care: Advanced Directive information    01/19/2024    1:18 PM  Advanced Directives  Does Patient Have a Medical Advance Directive? No     Chief Complaint  Patient presents with   Follow-up    4 month followup having aching in legs, discuss changing medication    Discussed the use of AI scribe software for clinical note transcription with the patient, who gave verbal consent to proceed.  History of Present Illness   Tanya Bowen "Tanya Bowen" is a 58 year old female who presents for a four-month follow-up visit.  She experiences achiness in one leg, attributed to a weak hip, which improves with regular exercise and stretching. She follows up with an orthopedic specialist and finds that exercises and stretches alleviate her symptoms. She is not currently receiving injections and feels good with the current exercise regimen.  She is not taking metformin  due to adverse effects, including nausea. She monitors her blood sugar at home, which remains below 120 mg/dL, but experiences symptoms of hypoglycemia, such as nausea, especially in the mornings before eating. Her A1c has improved from 6.7% to 6.5%.  She takes several supplements including vitamin C, vitamin D, cod liver oil, evening primrose, a multivitamin, omega-3 fatty acids, and Claritin as needed. She has stopped taking ibuprofen  but is open to refills for occasional aches. She was previously prescribed meloxicam  but did not take it due to lack of understanding of its purpose. She reports a history of inflammation affecting her skin and feet, with tenderness in the heel. She received a steroid injection in the  heel previously but was not prescribed ongoing treatment. She experiences generalized achiness and reports that she has been told by other clinicians that it may be due to inflammation.  Her cholesterol levels have decreased slightly, with total cholesterol at 220 mg/dL and LDL at 865 mg/dL. She is working on dietary changes, including reducing cheese intake and using almond milk instead of regular milk.  She experiences occasional constipation, which causes back pain until relieved. She maintains a diet high in vegetables and lean meats, and she exercises regularly. She has a special plate to control portion sizes and avoids sugary drinks.  She has not had a colonoscopy or shingles vaccine and is due for a mammogram. She experiences night sweats and increased thirst, and sometimes has difficulty urinating despite feeling the urge.    Past Medical History:  Diagnosis Date   Diabetes mellitus without complication (HCC)    Hyperlipidemia    Past Surgical History:  Procedure Laterality Date   childbirth  1982   1986, 1998   COLONOSCOPY  10/2020   DILATION AND CURETTAGE OF UTERUS      No Known Allergies  Allergies as of 03/31/2024   No Known Allergies      Medication List        Accurate as of Mar 31, 2024  6:52 PM. If you have any questions, ask your nurse or doctor.          STOP taking these medications    metFORMIN  500 MG tablet Commonly known as: GLUCOPHAGE  Stopped by: Shelvie Salsberry C Jarin Cornfield       TAKE these  medications    CALCIUM 500 PO Take by mouth.   cholecalciferol 1000 units tablet Commonly known as: VITAMIN D Take 1,000 Units by mouth daily.   COD LIVER OIL PO Take by mouth.   EVENING PRIMROSE OIL PO Take by mouth.   Fish Oil 1000 MG Caps Take by mouth.   fluticasone 0.05 % cream Commonly known as: CUTIVATE SMARTSIG:Topical 1-2 Times Daily PRN   glipiZIDE 5 MG tablet Commonly known as: GLUCOTROL Take 1 tablet (5 mg total) by mouth 2 (two) times  daily before a meal. Started by: Lashina Milles C Saqib Cazarez   glucose blood test strip 1 each by Other route daily. Use as instructed   ibuprofen  800 MG tablet Commonly known as: ADVIL  Take 1 tablet (800 mg total) by mouth every 8 (eight) hours as needed.   loratadine 10 MG tablet Commonly known as: CLARITIN Take 10 mg by mouth daily as needed for allergies.   meloxicam  15 MG tablet Commonly known as: MOBIC  Take 1 tablet (15 mg total) by mouth daily.   multivitamin capsule Take 1 capsule by mouth daily.   ONE TOUCH ULTRA 2 w/Device Kit 1 Device by Does not apply route daily.   OneTouch Delica Lancets 33G Misc 1 Device by Does not apply route daily.   pimecrolimus  1 % cream Commonly known as: Elidel  Apply topically 2 (two) times daily. Apply twice a day daily to face when flare occurs for 5 days   psyllium 58.6 % packet Commonly known as: METAMUCIL Take 1 packet by mouth daily.   vitamin B-12 100 MCG tablet Commonly known as: CYANOCOBALAMIN Take 100 mcg by mouth daily.   vitamin C 100 MG tablet Take 100 mg by mouth daily.        Review of Systems  Constitutional:  Negative for appetite change, chills, fatigue, fever and unexpected weight change.  HENT:  Negative for congestion, dental problem, ear discharge, ear pain, facial swelling, hearing loss, nosebleeds, postnasal drip, rhinorrhea, sinus pressure, sinus pain, sneezing, sore throat, tinnitus and trouble swallowing.   Eyes:  Negative for pain, discharge, redness, itching and visual disturbance.  Respiratory:  Negative for cough, chest tightness, shortness of breath and wheezing.   Cardiovascular:  Negative for chest pain, palpitations and leg swelling.  Gastrointestinal:  Positive for constipation. Negative for abdominal distention, abdominal pain, blood in stool, diarrhea, nausea and vomiting.  Endocrine: Negative for cold intolerance, heat intolerance, polydipsia, polyphagia and polyuria.  Genitourinary:  Negative for  difficulty urinating, dysuria, flank pain, frequency and urgency.  Musculoskeletal:  Positive for arthralgias. Negative for back pain, gait problem, joint swelling, myalgias, neck pain and neck stiffness.       Left knee chronic pain s/p Physical Therapy completion   Skin:  Negative for color change, pallor, rash and wound.  Neurological:  Negative for dizziness, syncope, speech difficulty, weakness, light-headedness, numbness and headaches.  Hematological:  Does not bruise/bleed easily.  Psychiatric/Behavioral:  Negative for agitation, behavioral problems, confusion, hallucinations, self-injury, sleep disturbance and suicidal ideas. The patient is not nervous/anxious.     Immunization History  Administered Date(s) Administered   Influenza Split 09/11/2016   Influenza,inj,Quad PF,6+ Mos 08/29/2021   Influenza-Unspecified 08/19/2023   PFIZER(Purple Top)SARS-COV-2 Vaccination 02/03/2020, 02/24/2020, 10/31/2020   Pertinent  Health Maintenance Due  Topic Date Due   Colonoscopy  Never done   INFLUENZA VACCINE  06/23/2024   MAMMOGRAM  04/05/2025      09/07/2023   12:58 PM 09/24/2023    2:20 PM 11/12/2023  2:05 PM 03/31/2024    1:03 PM  Fall Risk  Falls in the past year? 1 0 0 0  Was there an injury with Fall? 0 0 0 0  Fall Risk Category Calculator 2  0 0  Patient at Risk for Falls Due to No Fall Risks No Fall Risks  No Fall Risks  Fall risk Follow up Falls evaluation completed   Falls prevention discussed;Falls evaluation completed   Functional Status Survey:    Vitals:   03/31/24 1302  BP: 132/84  Pulse: 87  Temp: 97.6 F (36.4 C)  TempSrc: Temporal  SpO2: 97%  Weight: 253 lb 3.2 oz (114.9 kg)  Height: 5\' 4"  (1.626 m)   Body mass index is 43.46 kg/m. Physical Exam VITALS: P- 87, BP- 132/84, SaO2- 94% GENERAL: Alert, cooperative, well developed, no acute distress. HEENT: Normocephalic, normal oropharynx, moist mucous membranes, nose normal, no sinus tenderness. NECK:  No cervical lymphadenopathy, neck non-tender. CHEST: Clear to auscultation bilaterally, no wheezes, rhonchi, or crackles. CARDIOVASCULAR: Normal heart rate and rhythm, S1 and S2 normal without murmurs. ABDOMEN: Soft, non-tender, non-distended, without organomegaly, normal bowel sounds. EXTREMITIES: No cyanosis or edema. MUSCULOSKELETAL: Left knee pain on movement. NEUROLOGICAL: Cranial nerves grossly intact, moves all extremities without gross motor or sensory deficit.  SKIN: No rash,no lesion or erythema   PSYCHIATRY/BEHAVIORAL: Mood stable   Labs reviewed: Recent Labs    09/08/23 0807 11/12/23 1440 03/22/24 0804  NA 138 143 139  K 4.3 4.3 4.6  CL 104 106 104  CO2 24 30 26   GLUCOSE 108* 82 107*  BUN 17 12 22   CREATININE 0.93 0.95 0.95  CALCIUM 9.2 9.7 9.7   Recent Labs    09/08/23 0807 11/12/23 1440 03/22/24 0804  AST 16 19 15   ALT 16 21 16   BILITOT 0.4 0.3 0.4  PROT 7.1 7.0 7.2   Recent Labs    09/08/23 0807 11/12/23 1440 03/22/24 0804  WBC 5.6 5.8 7.2  NEUTROABS 2,828 2,651 3,902  HGB 14.0 13.2 14.0  HCT 43.7 40.6 44.2  MCV 85.7 84.8 87.0  PLT 362 332 348   Lab Results  Component Value Date   TSH 2.88 03/22/2024   Lab Results  Component Value Date   HGBA1C 6.5 (H) 03/22/2024   Lab Results  Component Value Date   CHOL 220 (H) 03/22/2024   HDL 70 03/22/2024   LDLCALC 131 (H) 03/22/2024   TRIG 91 03/22/2024   CHOLHDL 3.1 03/22/2024    Significant Diagnostic Results in last 30 days:  DG Foot Complete Left Result Date: 03/15/2024 Please see detailed radiograph report in office note.  DG Foot Complete Right Result Date: 03/15/2024 Please see detailed radiograph report in office note.   Assessment/Plan  Diabetes mellitus type 2 Type 2 diabetes with previous metformin  intolerance. Blood sugars generally below 120 mg/dL, with symptoms suggestive of hypoglycemia. A1c improved from 6.7% to 6.5%. - Discontinue metformin  due to intolerance. - Start  glipizide twice daily. - Monitor blood sugars at home and send log via MyChart after two weeks. - Encourage dietary modifications to maintain blood sugar control, including regular meals and portion control.  Hypertension Blood pressure well-controlled at 132/84 mmHg.  Hyperlipidemia Hyperlipidemia with total cholesterol at 220 mg/dL and LDL at 161 mg/dL. Previous levels were higher. Discussed dietary modifications to further reduce cholesterol levels. - Encourage dietary modifications, including reducing cheese intake and substituting with plant-based or low-fat options. - Continue monitoring cholesterol levels.  Osteoarthritis of knee Osteoarthritis of the  knee with intermittent pain and swelling. Symptoms improve with regular exercise and physical therapy. - Prescribe meloxicam  for arthritis-related pain. - Encourage continuation of physical therapy exercises to strengthen muscles around the knee. - Advise on the use of meloxicam  or ibuprofen  as needed, but not both simultaneously.  Chronic sinusitis Chronic sinusitis with symptoms managed by Claritin. Reports dry nose, likely due to Claritin use. - Continue Claritin as needed for sinusitis. - Consider using saline nasal spray to alleviate dryness.  Wellness Visit Routine wellness visit. Blood pressure well-controlled at 132/84 mmHg. Heart rate 87 bpm. Oxygen saturation 94%. - Continue current medications: vitamin C, vitamin D, cod liver oil, evening primrose, multivitamin, omega-3 fatty acids, and Claritin as needed. - Encourage regular exercise and stretching to manage hip and knee discomfort. - Discussed the importance of portion control and regular meals to manage diabetes and overall health.  General Health Maintenance Due for shingles vaccine, tetanus shot, COVID vaccine, and pneumonia vaccines. Mammogram and colonoscopy screenings are also due. - Advise obtaining shingles vaccine, tetanus shot, COVID vaccine, and pneumonia  vaccines at the pharmacy. - Schedule mammogram and colonoscopy screenings.  Follow-up Follow-up plans for diabetes management and routine screenings. - Schedule follow-up appointment in six months to reassess blood sugar and cholesterol levels. - Order lab work prior to next appointment. - Ensure gastroenterology contacts her for colonoscopy scheduling.   Family/ staff Communication: Reviewed plan of care with patient verbalized understanding   Labs/tests ordered:  - CBC with Differential/Platelet - CMP with eGFR([facility]) - TSH - Hgb A1C - Lipid panel - Urine Microalbumin creatinine ratio   Next Appointment : Return in about 6 months (around 10/01/2024) for medical mangement of chronic issues., Fasting labs in 6 months prior to visit.   Spent 35 minutes of Face to face and non-face to face with patient  >50% time spent counseling; reviewing medical record; tests; labs; documentation and developing future plan of care.   Estil Heman, NP

## 2024-04-01 LAB — MICROALBUMIN / CREATININE URINE RATIO
Creatinine, Urine: 198 mg/dL (ref 20–275)
Microalb, Ur: <0.2 mg/dL

## 2024-04-04 ENCOUNTER — Ambulatory Visit: Admitting: Family

## 2024-04-04 ENCOUNTER — Ambulatory Visit: Payer: Self-pay | Admitting: Family

## 2024-05-10 ENCOUNTER — Other Ambulatory Visit: Payer: Self-pay | Admitting: Family

## 2024-05-10 DIAGNOSIS — E1100 Type 2 diabetes mellitus with hyperosmolarity without nonketotic hyperglycemic-hyperosmolar coma (NKHHC): Secondary | ICD-10-CM

## 2024-05-10 MED ORDER — METFORMIN HCL 500 MG PO TABS: 500.0000 mg | ORAL_TABLET | Freq: Every day | ORAL | 1 refills | Status: DC

## 2024-05-11 ENCOUNTER — Other Ambulatory Visit: Payer: Self-pay | Admitting: Family

## 2024-05-11 DIAGNOSIS — Z1231 Encounter for screening mammogram for malignant neoplasm of breast: Secondary | ICD-10-CM

## 2024-05-12 ENCOUNTER — Ambulatory Visit
Admission: RE | Admit: 2024-05-12 | Discharge: 2024-05-12 | Disposition: A | Discharge status: Home / Self Care | Source: Ambulatory Visit

## 2024-05-12 DIAGNOSIS — Z1231 Encounter for screening mammogram for malignant neoplasm of breast: Secondary | ICD-10-CM

## 2024-06-05 ENCOUNTER — Ambulatory Visit: Admitting: Dermatology

## 2024-09-08 ENCOUNTER — Encounter: Payer: Commercial Managed Care - PPO | Admitting: Family

## 2024-09-21 ENCOUNTER — Other Ambulatory Visit: Payer: Self-pay | Admitting: Medical Genetics

## 2024-09-21 DIAGNOSIS — Z006 Encounter for examination for normal comparison and control in clinical research program: Secondary | ICD-10-CM

## 2024-09-25 ENCOUNTER — Encounter: Payer: Self-pay | Admitting: Radiology

## 2024-10-04 ENCOUNTER — Other Ambulatory Visit: Payer: Self-pay

## 2024-10-04 DIAGNOSIS — E782 Mixed hyperlipidemia: Secondary | ICD-10-CM

## 2024-10-04 DIAGNOSIS — E1100 Type 2 diabetes mellitus with hyperosmolarity without nonketotic hyperglycemic-hyperosmolar coma (NKHHC): Secondary | ICD-10-CM

## 2024-10-06 ENCOUNTER — Encounter: Payer: Commercial Managed Care - PPO | Admitting: Family

## 2024-10-06 ENCOUNTER — Ambulatory Visit: Payer: Self-pay | Admitting: Family

## 2024-12-07 ENCOUNTER — Other Ambulatory Visit: Payer: Self-pay | Admitting: Family

## 2024-12-07 DIAGNOSIS — E1100 Type 2 diabetes mellitus with hyperosmolarity without nonketotic hyperglycemic-hyperosmolar coma (NKHHC): Secondary | ICD-10-CM
